# Patient Record
Sex: Female | Born: 1935 | Race: White | Hispanic: No | Smoking: Never smoker
Health system: Southern US, Community
[De-identification: ages and names within clinical notes are randomized; demographics above are authoritative.]

## PROBLEM LIST (undated history)

## (undated) DIAGNOSIS — H3530 Unspecified macular degeneration: Secondary | ICD-10-CM

## (undated) DIAGNOSIS — K635 Polyp of colon: Secondary | ICD-10-CM

## (undated) DIAGNOSIS — K589 Irritable bowel syndrome without diarrhea: Secondary | ICD-10-CM

## (undated) DIAGNOSIS — F329 Major depressive disorder, single episode, unspecified: Secondary | ICD-10-CM

## (undated) DIAGNOSIS — E669 Obesity, unspecified: Secondary | ICD-10-CM

## (undated) DIAGNOSIS — F32A Depression, unspecified: Secondary | ICD-10-CM

## (undated) DIAGNOSIS — M1990 Unspecified osteoarthritis, unspecified site: Secondary | ICD-10-CM

## (undated) DIAGNOSIS — K5790 Diverticulosis of intestine, part unspecified, without perforation or abscess without bleeding: Secondary | ICD-10-CM

## (undated) DIAGNOSIS — F419 Anxiety disorder, unspecified: Secondary | ICD-10-CM

## (undated) HISTORY — DX: Major depressive disorder, single episode, unspecified: F32.9

## (undated) HISTORY — DX: Unspecified macular degeneration: H35.30

## (undated) HISTORY — DX: Diverticulosis of intestine, part unspecified, without perforation or abscess without bleeding: K57.90

## (undated) HISTORY — DX: Unspecified osteoarthritis, unspecified site: M19.90

## (undated) HISTORY — PX: BRAIN SURGERY: SHX531

## (undated) HISTORY — PX: NASAL SINUS SURGERY: SHX719

## (undated) HISTORY — DX: Polyp of colon: K63.5

## (undated) HISTORY — DX: Anxiety disorder, unspecified: F41.9

## (undated) HISTORY — PX: OTHER SURGICAL HISTORY: SHX169

## (undated) HISTORY — PX: ABDOMINAL HYSTERECTOMY: SHX81

## (undated) HISTORY — PX: GALLBLADDER SURGERY: SHX652

## (undated) HISTORY — DX: Obesity, unspecified: E66.9

## (undated) HISTORY — DX: Depression, unspecified: F32.A

## (undated) HISTORY — DX: Irritable bowel syndrome without diarrhea: K58.9

---

## 2016-04-29 ENCOUNTER — Other Ambulatory Visit: Payer: Self-pay | Admitting: Nurse Practitioner

## 2016-04-29 DIAGNOSIS — K5781 Diverticulitis of intestine, part unspecified, with perforation and abscess with bleeding: Secondary | ICD-10-CM

## 2016-05-06 ENCOUNTER — Other Ambulatory Visit: Payer: Self-pay

## 2016-05-09 ENCOUNTER — Ambulatory Visit
Admission: RE | Admit: 2016-05-09 | Discharge: 2016-05-09 | Disposition: A | Discharge status: Home / Self Care | Payer: Medicare Other | Source: Ambulatory Visit | Attending: Nurse Practitioner | Admitting: Nurse Practitioner

## 2016-05-09 DIAGNOSIS — K5781 Diverticulitis of intestine, part unspecified, with perforation and abscess with bleeding: Secondary | ICD-10-CM

## 2016-05-09 MED ORDER — IOPAMIDOL (ISOVUE-300) INJECTION 61%
100.0000 mL | Freq: Once | INTRAVENOUS | Status: AC | PRN
  Administered 2016-05-09: 100 mL via INTRAVENOUS

## 2016-07-02 ENCOUNTER — Encounter: Payer: Self-pay | Admitting: Physician Assistant

## 2016-07-15 ENCOUNTER — Ambulatory Visit: Payer: Medicare Other | Admitting: Physician Assistant

## 2016-07-22 ENCOUNTER — Encounter: Payer: Self-pay | Admitting: Physician Assistant

## 2016-07-22 ENCOUNTER — Ambulatory Visit (INDEPENDENT_AMBULATORY_CARE_PROVIDER_SITE_OTHER): Payer: 59 | Admitting: Physician Assistant

## 2016-07-22 ENCOUNTER — Encounter (INDEPENDENT_AMBULATORY_CARE_PROVIDER_SITE_OTHER): Payer: Self-pay

## 2016-07-22 VITALS — BP 120/80 | HR 74 | Ht 60.25 in | Wt 189.0 lb

## 2016-07-22 DIAGNOSIS — I10 Essential (primary) hypertension: Secondary | ICD-10-CM | POA: Diagnosis not present

## 2016-07-22 DIAGNOSIS — K582 Mixed irritable bowel syndrome: Secondary | ICD-10-CM | POA: Diagnosis not present

## 2016-07-22 DIAGNOSIS — K5730 Diverticulosis of large intestine without perforation or abscess without bleeding: Secondary | ICD-10-CM | POA: Diagnosis not present

## 2016-07-22 DIAGNOSIS — M1990 Unspecified osteoarthritis, unspecified site: Secondary | ICD-10-CM | POA: Insufficient documentation

## 2016-07-22 NOTE — Progress Notes (Addendum)
Subjective:    Patient ID: Karen Espinoza, female    DOB: Nov 04, 1924, 83 y.o.   MRN: [id number]  HPI Karen Espinoza is a pleasant 83 year old white female, new to GI today referred by Manus GunningMaggie Mazurek NP to get established with GI.  Patient has history of long-standing IBS and diverticulosis. [place] and is currently living at [facility]. Patient is not having any significant active problems. She says she went through a period of constipation and after relocating but that has improved. Her daughter says she tends to go through periods of diarrhea alternating with constipation. She has been using MiraLAX on an as-needed basis. Patient also is trying to drink prune juice daily and improve her water intake.   She says on a good day she'll have 1-2 bowel movements per day, on a bad day she may have a couple of episodes of diarrhea and then periodically get constipated. She knows that she has diverticulosis and avoids seeds and nuts she cannot tell me whether she's had diverticulitis in the past. She did have a gastroenterologist in [place] and has had several colonoscopies for history of polyps.  Review of Systems Pertinent positive and negative review of systems were noted in the above HPI section.  All other review of systems was otherwise negative.  Outpatient Encounter Prescriptions as of 07/22/2016  Medication Sig  . acetaminophen (TYLENOL) 650 MG CR tablet Take 650 mg by mouth every 6 (six) hours as needed for pain.  . Albuterol (VENTOLIN IN) Inhale into the lungs. Inhale 2 puffs every 4 hours as needed for SOB  . bisacodyl (DULCOLAX) 10 MG suppository Place 10 mg rectally daily as needed for moderate constipation.  . Calcium Carb-Cholecalciferol (CALTRATE 600+D) 600-800 MG-UNIT TABS Take by mouth.  . Calcium Carb-Cholecalciferol (CALTRATE 600+D3 SOFT) 600-800 MG-UNIT CHEW Chew by mouth. Take 1 chew twice daily    . cetirizine (ZYRTEC) 10 MG tablet Take 10 mg by mouth daily.  . fluticasone (FLONASE) 50 MCG/ACT nasal spray Place 2 sprays into both nostrils daily.  Marland Kitchen. gabapentin (NEURONTIN) 300 MG capsule Take 300 mg by mouth 3 (three) times daily.  Marland Kitchen. guaiFENesin-dextromethorphan (ROBITUSSIN DM) 100-10 MG/5ML syrup Take 10 mLs by mouth every 4 (four) hours as needed for cough.  . hydrocortisone (CORTEF) 10 MG tablet Take 10 mg by mouth daily.  Marland Kitchen. lisinopril (PRINIVIL,ZESTRIL) 40 MG tablet Take 40 mg by mouth daily.  Marland Kitchen. loperamide (IMODIUM A-D) 2 MG tablet Take 2 mg by mouth 4 (four) times daily as needed for diarrhea or loose stools.  Marland Kitchen. LORazepam (ATIVAN) 0.5 MG tablet Take 0.5 mg by mouth at bedtime.  Marland Kitchen. LORazepam (ATIVAN) 0.5 MG tablet Take 0.5 mg by mouth 2 (two) times daily as needed for anxiety.  . magnesium hydroxide (MILK OF MAGNESIA) 400 MG/5ML suspension Take 30 mLs by mouth daily as needed for mild constipation.  . meloxicam (MOBIC) 15 MG tablet Take 15 mg by mouth daily.  . metoprolol succinate (TOPROL-XL) 50 MG 24 hr tablet Take 50 mg by mouth 2 (two) times daily. Take with or immediately following a meal.  . MULTIPLE VITAMIN PO Take 1 tablet by mouth daily.  . ondansetron (ZOFRAN) 4 MG tablet Take 4 mg by mouth every 8 (eight) hours as needed for nausea or vomiting.  . polyethylene glycol (MIRALAX / GLYCOLAX) packet Take 17 g by mouth daily as needed.  . sertraline (ZOLOFT) 50 MG tablet Take 50 mg by  mouth at bedtime.  . simethicone (MYLICON) 125 MG chewable tablet Chew 125 mg by mouth every 6 (six) hours as needed for flatulence.  . sodium phosphate (FLEET) 7-19 GM/118ML ENEM Place 1 enema rectally daily as needed for severe constipation.   No facility-administered encounter medications on file as of 07/22/2016.    Allergies  Allergen Reactions  . Levaquin [Levofloxacin In D5w]   . Pneumococcal Vaccines   . Sulfa Antibiotics    There are no active problems to display for this  patient.  Social History   Social History  . Marital status: Widowed    Spouse name: N/A  . Number of children: 1  . Years of education: N/A   Occupational History  . retired    Social History Main Topics  . Smoking status: Never Smoker  . Smokeless tobacco: Never Used  . Alcohol use No  . Drug use: No  . Sexual activity: No   Other Topics Concern  . Not on file   Social History Narrative  . No narrative on file    Karen Espinoza family history is not on file.      Objective:    Vitals:   07/22/16 1014  BP: 120/80  Pulse: 74    Physical Exam  well-developed elderly white female in no acute distress, ambulate with a walker. Accompanied by her daughter blood pressure 120/80 pulse 74 patient speaks [place] primarily but able to answer some questions in [place]. HEENT; nontraumatic, cephalic EOMI PERRLA sclera anicteric, Cardiovascular; regular rate and rhythm with S1-S2 no murmur or gallop, Pulmonary ;clear bilaterally, Abdomen;; soft, obese, nontender nondistended bowel sounds are active there is no palpable mass or hepatosplenomegaly, Rectal; exam not done, Neuropsych; mood and affect appropriate       Assessment & Plan:   #45 83 year old white female primarily French-speaking with long-standing history of IBS and diverticulosis. She primarily has difficulty with constipation, and infrequently alternates between constipation and diarrhea. #2 osteoarthritis #3 hypertension #4 depression  Plan; patient has signed a release and will obtain copies of her previous GI records. She will continue to use MiraLAX 17 g in 8 ounces of water on an as-needed basis. We discussed prune juice on a daily basis and liberal water intake. We'll give her a trial of IB Gard  one by mouth twice a day Patient will be established with Dr. Lavon Paganini and will follow-up with Dr. Lavon Paganini or myself on an as-needed basis.  Addendum; previous records received. Colonoscopy February 2012 Dr.  Cheryl [facility] 7 mm cecal polyp sessile removed left-sided diverticuli and transverse colon diverticuli. Path report attached is unreadable. Also had upper GI small bowel follow-through done 2009. Normal      Amy S Esterwood PA-C 07/22/2016   Cc: Manus Gunning, FNP

## 2016-07-22 NOTE — Patient Instructions (Signed)
We have provided you with samples of IBgard . Take 1 capsule twice daily.  Continue Miralax as needed.  Drink 1 glass of prune juice daily. Push fluids. Follow up as needed.

## 2016-07-23 ENCOUNTER — Telehealth: Payer: Self-pay | Admitting: *Deleted

## 2016-07-23 NOTE — Progress Notes (Signed)
Reviewed and agree with documentation and assessment and plan. K. Veena Nandigam , MD   

## 2016-07-23 NOTE — Telephone Encounter (Signed)
Fax[facility] the [facility] note [facility], to [facility] [place]. Fax # [phone].

## 2016-07-23 NOTE — Progress Notes (Signed)
Reviewed and agree with documentation and assessment and plan. K. Veena Natalyia Innes , MD   

## 2016-07-29 ENCOUNTER — Telehealth: Payer: Self-pay | Admitting: Physician Assistant

## 2016-07-29 ENCOUNTER — Other Ambulatory Visit: Payer: Self-pay | Admitting: *Deleted

## 2016-07-29 MED ORDER — AMBULATORY NON FORMULARY MEDICATION: 3 refills | Status: DC

## 2016-07-29 NOTE — Progress Notes (Signed)
Spoke to [place] [facility] [place]. Faxed her a prescription  For the IB [facility] under Ambulatory-Non-Formulary. Patient is to take 1 capsule twice daily.

## 2016-07-29 NOTE — Telephone Encounter (Signed)
See note from 07-29-2016.

## 2016-08-26 ENCOUNTER — Emergency Department (HOSPITAL_COMMUNITY): Payer: Medicare Other

## 2016-08-26 ENCOUNTER — Encounter (HOSPITAL_COMMUNITY): Payer: Self-pay

## 2016-08-26 ENCOUNTER — Inpatient Hospital Stay (HOSPITAL_COMMUNITY)
Admission: EM | Admit: 2016-08-26 | Discharge: 2016-08-28 | DRG: 291 | Disposition: A | Discharge status: Skilled Nursing Facility | Payer: Medicare Other | Attending: Internal Medicine | Admitting: Internal Medicine

## 2016-08-26 DIAGNOSIS — F419 Anxiety disorder, unspecified: Secondary | ICD-10-CM | POA: Diagnosis present

## 2016-08-26 DIAGNOSIS — I509 Heart failure, unspecified: Secondary | ICD-10-CM | POA: Diagnosis not present

## 2016-08-26 DIAGNOSIS — J9601 Acute respiratory failure with hypoxia: Secondary | ICD-10-CM | POA: Diagnosis not present

## 2016-08-26 DIAGNOSIS — F329 Major depressive disorder, single episode, unspecified: Secondary | ICD-10-CM | POA: Diagnosis present

## 2016-08-26 DIAGNOSIS — I110 Hypertensive heart disease with heart failure: Secondary | ICD-10-CM | POA: Diagnosis not present

## 2016-08-26 DIAGNOSIS — R0602 Shortness of breath: Secondary | ICD-10-CM

## 2016-08-26 DIAGNOSIS — Z6833 Body mass index (BMI) 33.0-33.9, adult: Secondary | ICD-10-CM

## 2016-08-26 DIAGNOSIS — I5033 Acute on chronic diastolic (congestive) heart failure: Secondary | ICD-10-CM | POA: Diagnosis present

## 2016-08-26 DIAGNOSIS — Z79899 Other long term (current) drug therapy: Secondary | ICD-10-CM

## 2016-08-26 DIAGNOSIS — E669 Obesity, unspecified: Secondary | ICD-10-CM | POA: Diagnosis present

## 2016-08-26 DIAGNOSIS — H3530 Unspecified macular degeneration: Secondary | ICD-10-CM | POA: Diagnosis present

## 2016-08-26 DIAGNOSIS — F0390 Unspecified dementia without behavioral disturbance: Secondary | ICD-10-CM | POA: Diagnosis present

## 2016-08-26 DIAGNOSIS — R7989 Other specified abnormal findings of blood chemistry: Secondary | ICD-10-CM

## 2016-08-26 LAB — COMPREHENSIVE METABOLIC PANEL
ALBUMIN: 4.3 g/dL (ref 3.5–5.0)
ALK PHOS: 93 U/L (ref 38–126)
ALT: 25 U/L (ref 14–54)
AST: 22 U/L (ref 15–41)
Anion gap: 8 (ref 5–15)
BILIRUBIN TOTAL: 1.4 mg/dL — AB (ref 0.3–1.2)
BUN: 24 mg/dL — AB (ref 6–20)
CALCIUM: 9.2 mg/dL (ref 8.9–10.3)
CO2: 33 mmol/L — ABNORMAL HIGH (ref 22–32)
Chloride: 102 mmol/L (ref 101–111)
Creatinine, Ser: 0.82 mg/dL (ref 0.44–1.00)
GFR calc Af Amer: >60 mL/min (ref >60)
GLUCOSE: 94 mg/dL (ref 65–99)
POTASSIUM: 4.6 mmol/L (ref 3.5–5.1)
Sodium: 143 mmol/L (ref 135–145)
TOTAL PROTEIN: 7.1 g/dL (ref 6.5–8.1)

## 2016-08-26 LAB — CBC WITH DIFFERENTIAL/PLATELET
Basophils Absolute: 0 10*3/uL (ref 0.0–0.1)
Basophils Relative: 0 %
Eosinophils Absolute: 0.2 10*3/uL (ref 0.0–0.7)
Eosinophils Relative: 2 %
HCT: 43.6 % (ref 36.0–46.0)
Hemoglobin: 14.2 g/dL (ref 12.0–15.0)
Lymphocytes Relative: 12 %
Lymphs Abs: 1.3 10*3/uL (ref 0.7–4.0)
MCH: 32.1 pg (ref 26.0–34.0)
MCHC: 32.6 g/dL (ref 30.0–36.0)
MCV: 98.4 fL (ref 78.0–100.0)
Monocytes Absolute: 0.4 10*3/uL (ref 0.1–1.0)
Monocytes Relative: 4 %
Neutro Abs: 8.6 10*3/uL — ABNORMAL HIGH (ref 1.7–7.7)
Neutrophils Relative %: 82 %
Platelets: 317 10*3/uL (ref 150–400)
RBC: 4.43 MIL/uL (ref 3.87–5.11)
RDW: 15.2 % (ref 11.5–15.5)
WBC: 10.5 10*3/uL (ref 4.0–10.5)

## 2016-08-26 LAB — URINALYSIS, ROUTINE W REFLEX MICROSCOPIC
Bilirubin Urine: NEGATIVE
Glucose, UA: NEGATIVE mg/dL
Hgb urine dipstick: NEGATIVE
Ketones, ur: NEGATIVE mg/dL
Nitrite: NEGATIVE
Protein, ur: NEGATIVE mg/dL
Specific Gravity, Urine: 1.012 (ref 1.005–1.030)
pH: 6 (ref 5.0–8.0)

## 2016-08-26 LAB — PROTIME-INR
INR: 1.15
Prothrombin Time: 14.8 seconds (ref 11.4–15.2)

## 2016-08-26 LAB — BRAIN NATRIURETIC PEPTIDE: B Natriuretic Peptide: 253.8 pg/mL — ABNORMAL HIGH (ref 0.0–100.0)

## 2016-08-26 LAB — MRSA PCR SCREENING: MRSA BY PCR: NEGATIVE

## 2016-08-26 LAB — TROPONIN I: Troponin I: <0.03 ng/mL (ref <0.03)

## 2016-08-26 LAB — I-STAT CG4 LACTIC ACID, ED: LACTIC ACID, VENOUS: 0.74 mmol/L (ref 0.5–1.9)

## 2016-08-26 MED ORDER — ACETAMINOPHEN 650 MG RE SUPP: 650.0000 mg | Freq: Four times a day (QID) | RECTAL | Status: DC | PRN

## 2016-08-26 MED ORDER — LORAZEPAM 0.5 MG PO TABS
0.5000 mg | ORAL_TABLET | Freq: Every day | ORAL | Status: DC
  Administered 2016-08-26 – 2016-08-27 (×2): 0.5 mg via ORAL
  Filled 2016-08-26 (×2): qty 1

## 2016-08-26 MED ORDER — SODIUM CHLORIDE 0.9% FLUSH: 3.0000 mL | INTRAVENOUS | Status: DC | PRN

## 2016-08-26 MED ORDER — GABAPENTIN 300 MG PO CAPS
300.0000 mg | ORAL_CAPSULE | Freq: Three times a day (TID) | ORAL | Status: DC
  Administered 2016-08-26 – 2016-08-28 (×7): 300 mg via ORAL
  Filled 2016-08-26 (×6): qty 1

## 2016-08-26 MED ORDER — SODIUM CHLORIDE 0.9 % IV SOLN: 250.0000 mL | INTRAVENOUS | Status: DC | PRN

## 2016-08-26 MED ORDER — ENOXAPARIN SODIUM 40 MG/0.4ML ~~LOC~~ SOLN
40.0000 mg | SUBCUTANEOUS | Status: DC
  Administered 2016-08-26 – 2016-08-27 (×2): 40 mg via SUBCUTANEOUS
  Filled 2016-08-26 (×2): qty 0.4

## 2016-08-26 MED ORDER — SERTRALINE HCL 50 MG PO TABS
50.0000 mg | ORAL_TABLET | Freq: Every day | ORAL | Status: DC
  Administered 2016-08-26 – 2016-08-27 (×2): 50 mg via ORAL
  Filled 2016-08-26 (×2): qty 1

## 2016-08-26 MED ORDER — POLYETHYLENE GLYCOL 3350 17 G PO PACK: 17.0000 g | PACK | Freq: Every day | ORAL | Status: DC | PRN

## 2016-08-26 MED ORDER — FUROSEMIDE 10 MG/ML IJ SOLN
20.0000 mg | INTRAMUSCULAR | Status: AC
  Administered 2016-08-26: 20 mg via INTRAVENOUS
  Filled 2016-08-26: qty 4

## 2016-08-26 MED ORDER — ACETAMINOPHEN 325 MG PO TABS: 650.0000 mg | ORAL_TABLET | Freq: Four times a day (QID) | ORAL | Status: DC | PRN

## 2016-08-26 MED ORDER — FUROSEMIDE 10 MG/ML IJ SOLN
20.0000 mg | Freq: Two times a day (BID) | INTRAMUSCULAR | Status: DC
  Administered 2016-08-27 – 2016-08-28 (×4): 20 mg via INTRAVENOUS
  Filled 2016-08-26 (×4): qty 2

## 2016-08-26 MED ORDER — SODIUM CHLORIDE 0.9% FLUSH
3.0000 mL | Freq: Two times a day (BID) | INTRAVENOUS | Status: DC
  Administered 2016-08-26 – 2016-08-28 (×3): 3 mL via INTRAVENOUS

## 2016-08-26 MED ORDER — SODIUM CHLORIDE 0.9% FLUSH
3.0000 mL | Freq: Two times a day (BID) | INTRAVENOUS | Status: DC
  Administered 2016-08-26 – 2016-08-28 (×4): 3 mL via INTRAVENOUS

## 2016-08-26 MED ORDER — LISINOPRIL 20 MG PO TABS
40.0000 mg | ORAL_TABLET | Freq: Every day | ORAL | Status: DC
  Administered 2016-08-27 – 2016-08-28 (×2): 40 mg via ORAL
  Filled 2016-08-26 (×2): qty 2

## 2016-08-26 MED ORDER — HYDROCORTISONE 10 MG PO TABS
10.0000 mg | ORAL_TABLET | Freq: Every day | ORAL | Status: DC
  Administered 2016-08-27 – 2016-08-28 (×2): 10 mg via ORAL
  Filled 2016-08-26 (×2): qty 1

## 2016-08-26 MED ORDER — DONEPEZIL HCL 5 MG PO TABS
5.0000 mg | ORAL_TABLET | Freq: Every day | ORAL | Status: DC
  Administered 2016-08-26 – 2016-08-27 (×2): 5 mg via ORAL
  Filled 2016-08-26 (×2): qty 1

## 2016-08-26 NOTE — ED Triage Notes (Addendum)
Patient BIB EMS [facility]. Staff told EMS that the patient has had increased SOB- staff questions if the patient has pneumonia. Patient does not usually require oxygen, but sats in triage are 90-92% on RA. Patient went to the bathroom prior to triage being completed using the [facility]- upon returning to the room, patient's oxygen saturations on RA were 81% and patient was very SOB. On 2LNC, patient's oxygen saturations return to 96%. Patient is alert and oriented in triage.

## 2016-08-26 NOTE — ED Notes (Signed)
Bed: WA04 Expected date:  Expected time:  Means of arrival:  Comments: EMS-PNA 

## 2016-08-26 NOTE — ED Provider Notes (Signed)
[facility]-EMERGENCY DEPT Provider Note   CSN: [id number] Arrival date & time: 08/26/16  1200     History   Chief Complaint Chief Complaint  Patient presents with  . Shortness of Breath    HPI Karen Espinoza is a 83 y.o. female.  Karen Espinoza is a 83 y.o. Female who presents to the emergency department from her skilled nursing facility complaining of increasing shortness of breath and fatigue over the past week. Patient reports she hears a rattle in her chest and is worried she might have pneumonia. She denies increased coughing or fevers. She reports she has some mild leg swelling, but it has not increased from her baseline. She denies any history of CHF or heart failure. She reports her shortness of breath is worse with exertion. She does not use oxygen at home. She has required oxygen since arrival to the emergency department and according to EMS her oxygen saturation dropped to 81% on room air while ambulating today.She denies fevers, abdominal pain, nausea, vomiting, increased coughing, hemoptysis, chest pain, or rashes.   The history is provided by the patient and medical records. No language interpreter was used.  Shortness of Breath  Associated symptoms include leg swelling. Pertinent negatives include no fever, no headaches, no sore throat, no neck pain, no cough, no wheezing, no chest pain, no vomiting, no abdominal pain and no rash.    Past Medical History:  Diagnosis Date  . Anxiety   . Colon polyps   . Depression   . Diverticulosis   . IBS (irritable bowel syndrome)   . Macular degeneration   . Obesity   . Osteoarthritis     Patient Active Problem List   Diagnosis Date Noted  . HTN (hypertension) 07/22/2016  . OA (osteoarthritis) 07/22/2016    Past Surgical History:  Procedure Laterality Date  . ABDOMINAL HYSTERECTOMY    . BRAIN SURGERY     benign tumor  . colonscopy    . GALLBLADDER SURGERY    . NASAL SINUS SURGERY      OB History    No data available       Home Medications    Prior to Admission medications   Medication Sig Start Date End Date Taking? Authorizing Provider  acetaminophen (TYLENOL) 650 MG CR tablet Take 650 mg by mouth every 6 (six) hours as needed for pain.   Yes [provider]  albuterol (PROVENTIL HFA;VENTOLIN HFA) 108 (90 Base) MCG/ACT inhaler Inhale 2 puffs into the lungs every 4 (four) hours as needed for wheezing or shortness of breath.   Yes [provider]  AMBULATORY NON FORMULARY MEDICATION Medication Name: IBGard  Take 1 capsule by mouth twice daily. 07/29/16  Yes Esterwood, Amy S, PA-C  bisacodyl (DULCOLAX) 10 MG suppository Place 10 mg rectally daily as needed for moderate constipation.   Yes [provider]  Calcium Carb-Cholecalciferol (CALTRATE 600+D3 SOFT) 600-800 MG-UNIT CHEW Chew by mouth. Take 1 chew twice daily   Yes [provider]  cetirizine (ZYRTEC) 10 MG tablet Take 10 mg by mouth daily.   Yes [provider]  donepezil (ARICEPT) 5 MG tablet Take 5 mg by mouth at bedtime.   Yes [provider]  fluticasone (FLONASE) 50 MCG/ACT nasal spray Place 2 sprays into both nostrils daily.   Yes [provider]  gabapentin (NEURONTIN) 300 MG capsule Take 300 mg by mouth 3 (three) times daily.   Yes [provider]  guaiFENesin-dextromethorphan (ROBITUSSIN DM) 100-10 MG/5ML syrup Take 10 mLs  by mouth every 4 (four) hours as needed for cough.   Yes [provider]  hydrocortisone (CORTEF) 10 MG tablet Take 10 mg by mouth daily.   Yes [provider]  ibuprofen (ADVIL,MOTRIN) 400 MG tablet Take 400 mg by mouth every 6 (six) hours as needed.   Yes [provider]  lisinopril (PRINIVIL,ZESTRIL) 40 MG tablet Take 40 mg by mouth daily.   Yes [provider]  loperamide (IMODIUM A-D) 2 MG tablet Take 2 mg by mouth 4 (four) times daily as needed for diarrhea or loose stools.   Yes [provider]  LORazepam (ATIVAN) 0.5 MG tablet Take 0.5 mg by mouth at bedtime.   Yes [provider]  LORazepam (ATIVAN) 0.5 MG tablet Take 0.5 mg by mouth 2 (two) times daily as needed for anxiety.   Yes [provider]  magnesium hydroxide (MILK OF MAGNESIA) 400 MG/5ML suspension Take 30 mLs by mouth daily as needed for mild constipation.   Yes [provider]  meloxicam (MOBIC) 15 MG tablet Take 15 mg by mouth daily.   Yes [provider]  metoprolol tartrate (LOPRESSOR) 50 MG tablet Take 50 mg by mouth 2 (two) times daily.   Yes [provider]  Multiple Vitamin tablet Take 1 tablet by mouth daily. Daily vite   Yes [provider]  ondansetron (ZOFRAN) 4 MG tablet Take 4 mg by mouth every 8 (eight) hours as needed for nausea or vomiting.   Yes [provider]  polyethylene glycol (MIRALAX / GLYCOLAX) packet Take 17 g by mouth daily as needed.   Yes [provider]  sertraline (ZOLOFT) 50 MG tablet Take 50 mg by mouth at bedtime.   Yes [provider]  simethicone (MYLICON) 125 MG chewable tablet Chew 125 mg by mouth every 6 (six) hours as needed for flatulence.   Yes [provider]  sodium phosphate (FLEET) 7-19 GM/118ML ENEM Place 1 enema rectally daily as needed for severe constipation.   Yes [provider]    Family History History reviewed. No pertinent family history.  Social History Social History  Substance Use Topics  . Smoking status: Never Smoker  . Smokeless tobacco: Never Used  . Alcohol use No     Allergies   Levaquin [levofloxacin in d5w]; Pneumococcal vaccines; and Sulfa antibiotics   Review of Systems Review of Systems  Constitutional: Positive for fatigue. Negative for chills and fever.  HENT: Negative for congestion and sore throat.   Eyes: Negative for visual disturbance.  Respiratory: Positive for shortness of breath. Negative for cough and wheezing.     Cardiovascular: Positive for leg swelling. Negative for chest pain and palpitations.  Gastrointestinal: Negative for abdominal pain, diarrhea, nausea and vomiting.  Genitourinary: Negative for dysuria.  Musculoskeletal: Negative for back pain and neck pain.  Skin: Negative for rash.  Neurological: Negative for headaches.     Physical Exam Updated Vital Signs BP (!) 184/66   Pulse (!) 55   Temp 98.6 F (37 C) (Oral)   Resp 19   SpO2 99%   Physical Exam  Constitutional: She is oriented to person, place, and time. She appears well-developed and well-nourished. No distress.  Nontoxic appearing.  HENT:  Head: Normocephalic and atraumatic.  Mouth/Throat: Oropharynx is clear and moist.  Eyes: Pupils are equal, round, and reactive to light. Conjunctivae are normal. Right eye exhibits no discharge. Left eye exhibits no discharge.  Neck: Neck supple.  Cardiovascular: Normal rate, regular rhythm,  normal heart sounds and intact distal pulses.  Exam reveals no gallop and no friction rub.   No murmur heard. Pulmonary/Chest: Effort normal. No respiratory distress. She has no wheezes. She has rales.  Crackles noted to her bilateral bases. No increased work of breathing. Oxygen saturation 95% on 2 L via nasal cannula.  Abdominal: Soft. There is no tenderness.  Musculoskeletal: She exhibits edema.  Mild bilateral pitting lower extremity edema.  Lymphadenopathy:    She has no cervical adenopathy.  Neurological: She is alert and oriented to person, place, and time. Coordination normal.  Skin: Skin is warm and dry. No rash noted. She is not diaphoretic. No erythema. No pallor.  Psychiatric: She has a normal mood and affect. Her behavior is normal.  Nursing note and vitals reviewed.    ED Treatments / Results  Labs (all labs ordered are listed, but only abnormal results are displayed) Labs Reviewed  COMPREHENSIVE METABOLIC PANEL - Abnormal; Notable for the following:       Result Value    CO2 33 (*)    BUN 24 (*)    Total Bilirubin 1.4 (*)    All other components within normal limits  URINALYSIS, ROUTINE W REFLEX MICROSCOPIC - Abnormal; Notable for the following:    APPearance HAZY (*)    Leukocytes, UA TRACE (*)    Bacteria, UA RARE (*)    Squamous Epithelial / LPF 0-5 (*)    All other components within normal limits  CBC WITH DIFFERENTIAL/PLATELET - Abnormal; Notable for the following:    Neutro Abs 8.6 (*)    All other components within normal limits  BRAIN NATRIURETIC PEPTIDE - Abnormal; Notable for the following:    B Natriuretic Peptide 253.8 (*)    All other components within normal limits  CULTURE, BLOOD (ROUTINE X 2)  CULTURE, BLOOD (ROUTINE X 2)  PROTIME-INR  TROPONIN I  CBC WITH DIFFERENTIAL/PLATELET  BRAIN NATRIURETIC PEPTIDE  TROPONIN I  I-STAT CG4 LACTIC ACID, ED  I-STAT CG4 LACTIC ACID, ED    EKG  EKG Interpretation  Date/Time:  Tuesday August 26 2016 12:25:02 EDT Ventricular Rate:  64 PR Interval:    QRS Duration: 92 QT Interval:  408 QTC Calculation: 421 R Axis:   25 Text Interpretation:  Sinus rhythm Consider left atrial enlargement No acute changes No old tracing to compare Confirmed by Derwood Kaplan ([id number]) on 08/26/2016 2:30:59 PM       Radiology Dg Chest 2 View  Result Date: 08/26/2016 CLINICAL DATA:  Shortness of breath. EXAM: CHEST  2 VIEW COMPARISON:  No prior. FINDINGS: Borderline cardiomegaly. Low lung volumes. Mild bilateral pulmonary infiltrates/edema. Bilateral pleural effusions. No pneumothorax. IMPRESSION: Low lung volumes . Bilateral pulmonary infiltrates/edema and bilateral pleural effusions. Electronically Signed   By: Maisie Fus  Register   On: 08/26/2016 12:45    Procedures Procedures (including critical care time)  Medications Ordered in ED Medications  furosemide (LASIX) injection 20 mg (not administered)     Initial Impression / Assessment and Plan / ED Course  I have reviewed the triage vital signs and the  nursing notes.  Pertinent labs & imaging results that were available during my care of the patient were reviewed by me and considered in my medical decision making (see chart for details).    This is a 83 y.o. Female who presents to the emergency department from her skilled nursing facility complaining of increasing shortness of breath and fatigue over the past week. Patient reports she hears a rattle in  her chest and is worried she might have pneumonia. She denies increased coughing or fevers. She reports she has some mild leg swelling, but it has not increased from her baseline. She denies any history of CHF or heart failure. She reports her shortness of breath is worse with exertion. She does not use oxygen at home. She has required oxygen since arrival to the emergency department and according to EMS her oxygen saturation dropped to 81% on room air while ambulating today. On exam the patient is afebrile nontoxic appearing. She has crackles noted to her bilateral lower bases on lung exam. She is no increased work of breathing. She is requiring oxygen in the emergency department. She does have some mild lower extremity edema. She's had no increased coughing or fevers. Concern for possible CHF exacerbation. Patient denies any history of CHF and does not appear to be on any diuretics. No history of her recent echocardiogram. Lactic acid is within normal limits. His CMP shows preserved kidney function. Troponin is not elevated. CBC shows no leukocytosis. BNP is elevated at 253. Chest x-ray shows low lung volumes and bilateral pulmonary infiltrates versus edema and bilateral pleural effusions. After viewing the x-ray myself and is suspicious for CHF in this patient, based on history, exam and lab findings. She has no fever, no leukocytosis and no increased cough. Have lower suspicion for pneumonia. Will start the patient on 20 mg of IV Lasix that she is lasix nave. Will admit to medicine for continued work  up. Patient agrees with plan for admission.  I consulted with triad hospitalist Dr. Irene Limbo who accepted the patient for admission.   This patient was discussed with and evaluated by Dr. Rhunette Croft who agrees with assessment and plan.   Final Clinical Impressions(s) / ED Diagnoses   Final diagnoses:  Shortness of breath  Acute congestive heart failure, unspecified heart failure type (HCC)  Elevated brain natriuretic peptide (BNP) level    New Prescriptions New Prescriptions   No medications on file     Everlene Farrier, Cordelia Poche 08/26/16 1528    Derwood Kaplan, MD 08/27/16 [phone]

## 2016-08-26 NOTE — H&P (Addendum)
History and Physical  Karen Espinoza GNF:[id number] DOB: [birth year] DOA: 08/26/2016  PCP: Manus Gunning, FNP  Patient coming from: assisted living  Chief Complaint: short of breath  HPI:  83 year old-year-old woman, uncomplicated past medical history, presented with increasing shortness of breath. Initial evaluation suggested acute CHF.  Reports increasing shortness of breath over the last 1-2 weeks, worsening with time, aggravated by lying flat, improved with sitting up, becoming more severe. No history of cardiac disease. No cough or fever.  ED Course: Afebrile, hypertensive but vital signs stable   Review of Systems:  Negative for fever, new visual changes, sore throat, rash, new muscle aches, chest pain, dysuria, bleeding, nausea, vomiting, abdominal pain.   Past Medical History:  Diagnosis Date  . Anxiety   . Colon polyps   . Depression   . Diverticulosis   . IBS (irritable bowel syndrome)   . Macular degeneration   . Obesity   . Osteoarthritis     Past Surgical History:  Procedure Laterality Date  . ABDOMINAL HYSTERECTOMY    . BRAIN SURGERY     benign tumor  . colonscopy    . GALLBLADDER SURGERY    . NASAL SINUS SURGERY       reports that she has never smoked. She has never used smokeless tobacco. She reports that she does not drink alcohol or use drugs.   Allergies  Allergen Reactions  . Levaquin [Levofloxacin In D5w]   . Pneumococcal Vaccines   . Sulfa Antibiotics     Family History  Problem Relation Age of Onset  . Family history unknown: Yes     Prior to Admission medications   Medication Sig Start Date End Date Taking? Authorizing Provider  acetaminophen (TYLENOL) 650 MG CR tablet Take 650 mg by mouth every 6 (six) hours as needed for pain.   Yes [provider]  albuterol (PROVENTIL HFA;VENTOLIN HFA) 108 (90 Base) MCG/ACT inhaler Inhale 2 puffs into the lungs every 4 (four) hours as needed for wheezing or shortness of breath.    Yes [provider]  AMBULATORY NON FORMULARY MEDICATION Medication Name: IBGard  Take 1 capsule by mouth twice daily. 07/29/16  Yes Esterwood, Amy S, PA-C  bisacodyl (DULCOLAX) 10 MG suppository Place 10 mg rectally daily as needed for moderate constipation.   Yes [provider]  Calcium Carb-Cholecalciferol (CALTRATE 600+D3 SOFT) 600-800 MG-UNIT CHEW Chew by mouth. Take 1 chew twice daily   Yes [provider]  cetirizine (ZYRTEC) 10 MG tablet Take 10 mg by mouth daily.   Yes [provider]  donepezil (ARICEPT) 5 MG tablet Take 5 mg by mouth at bedtime.   Yes [provider]  fluticasone (FLONASE) 50 MCG/ACT nasal spray Place 2 sprays into both nostrils daily.   Yes [provider]  gabapentin (NEURONTIN) 300 MG capsule Take 300 mg by mouth 3 (three) times daily.   Yes [provider]  guaiFENesin-dextromethorphan (ROBITUSSIN DM) 100-10 MG/5ML syrup Take 10 mLs by mouth every 4 (four) hours as needed for cough.   Yes [provider]  hydrocortisone (CORTEF) 10 MG tablet Take 10 mg by mouth daily.   Yes [provider]  ibuprofen (ADVIL,MOTRIN) 400 MG tablet Take 400 mg by mouth every 6 (six) hours as needed.   Yes [provider]  lisinopril (PRINIVIL,ZESTRIL) 40 MG tablet Take 40 mg by mouth daily.   Yes [provider]  loperamide (IMODIUM A-D) 2 MG tablet Take 2 mg by mouth 4 (four) times daily  as needed for diarrhea or loose stools.   Yes [provider]  LORazepam (ATIVAN) 0.5 MG tablet Take 0.5 mg by mouth at bedtime.   Yes [provider]  LORazepam (ATIVAN) 0.5 MG tablet Take 0.5 mg by mouth 2 (two) times daily as needed for anxiety.   Yes [provider]  magnesium hydroxide (MILK OF MAGNESIA) 400 MG/5ML suspension Take 30 mLs by mouth daily as needed for mild constipation.   Yes [provider]  meloxicam (MOBIC) 15 MG tablet Take 15 mg by mouth  daily.   Yes [provider]  metoprolol tartrate (LOPRESSOR) 50 MG tablet Take 50 mg by mouth 2 (two) times daily.   Yes [provider]  Multiple Vitamin tablet Take 1 tablet by mouth daily. Daily vite   Yes [provider]  ondansetron (ZOFRAN) 4 MG tablet Take 4 mg by mouth every 8 (eight) hours as needed for nausea or vomiting.   Yes [provider]  polyethylene glycol (MIRALAX / GLYCOLAX) packet Take 17 g by mouth daily as needed.   Yes [provider]  sertraline (ZOLOFT) 50 MG tablet Take 50 mg by mouth at bedtime.   Yes [provider]  simethicone (MYLICON) 125 MG chewable tablet Chew 125 mg by mouth every 6 (six) hours as needed for flatulence.   Yes [provider]  sodium phosphate (FLEET) 7-19 GM/118ML ENEM Place 1 enema rectally daily as needed for severe constipation.   Yes [provider]    Physical Exam: 98.6, 16, 65, 193/69, 96% on 2 L  Constitutional. Appears calm, comfortable.  Psychiatric. Grossly normal mood and affect. Speech fluent and appropriate.  Eyes. Pupils, irises, lids appear unremarkable.  ENT. Grossly normal lips, tongue, hearing.  Neck. No lymphadenopathy or masses. No thyromegaly.  Cardiovascular. Regular rate and rhythm. No murmur, rub or gallop. 2-3 plus bilateral lower extremity edema.  Respiratory. Bilateral rales. No rhonchi or wheezes. Mild increased respiratory effort.  Abdomen. Soft, nontender, nondistended. No hepatomegaly noted.  Skin. No rash or induration. Nontender to palpation.  Musculoskeletal. Grossly normal tone and strength all extremities. Digits and nails of the upper extremities appear unremarkable.  Wt Readings from Last 3 Encounters:  08/26/16 88.5 kg (195 lb 1.7 oz)  07/22/16 85.7 kg (189 lb)    I have personally reviewed following labs and imaging studies  Labs:   Complete metabolic panel unremarkable  Troponin negative, lactic acid within  normal limits, BNP modestly elevated  CBC unremarkable  Imaging studies:   Chest x-ray independently reviewed, bilateral infiltrates, suspect edema.  Medical tests:   EKG independently reviewed sinus rhythm, no acute changes.  Test discussed with performing physician:    Decision to obtain old records:     Review and summation of old records:     Principal Problem:   Acute hypoxemic respiratory failure (HCC) Active Problems:   Acute CHF (HCC)   Assessment/Plan 1. Acute hypoxic respiratory failure, most likely secondary to underlying CHF. -Supplemental oxygen, wean as tolerated  2. Suspected acute CHF, type unknown, associated with bilateral lower extremity edema, hypoxia and shortness of breath. BNP was modestly elevated. No chest pain or history to suggest ACS. EKG nonacute. Suspect diastolic dysfunction. -IV diuresis, daily BMP, telemetry, wean oxygen as tolerated -2-D echocardiogram -Discontinue ibuprofen, Mobic    Severity of Illness: The appropriate patient status for this patient is OBSERVATION. Observation status is judged to be reasonable and necessary in order to provide the required intensity of service  to ensure the patient's safety. The patient's presenting symptoms, physical exam findings, and initial radiographic and laboratory data in the context of their medical condition is felt to place them at decreased risk for further clinical deterioration. Furthermore, it is anticipated that the patient will be medically stable for discharge from the hospital within 2 midnights of admission. The following factors support the patient status of observation.   " The patient's presenting symptoms include shortness of breath. " The physical exam findings include hypoxia, lower extremity edema. " The initial radiographic and laboratory data are pulmonary edema.      DVT prophylaxis: Lovenox Code Status: full Family Communication: none Consults called: none     Time spent: 60 minutes  Brendia Sacksaniel Goodrich, MD  [facility] Direct contact: [phone] --Via amion app OR  --[email or web address]; password [email or web address]  7PM-7AM contact night coverage as above  08/26/2016, 6:17 PM

## 2016-08-26 NOTE — ED Notes (Signed)
Report called to Sharlene Dory4W, Sofia RN. Questions answered. Orders reviewed.

## 2016-08-26 NOTE — ED Notes (Signed)
Patient transported to 4W on telemetry and [facility]. PIV saline locked. Patient and daughter updated on POC. Patient assisted to the restroom prior to transport upstairs.

## 2016-08-27 ENCOUNTER — Encounter (HOSPITAL_COMMUNITY): Payer: Self-pay

## 2016-08-27 ENCOUNTER — Observation Stay (HOSPITAL_BASED_OUTPATIENT_CLINIC_OR_DEPARTMENT_OTHER): Payer: Medicare Other

## 2016-08-27 DIAGNOSIS — H3530 Unspecified macular degeneration: Secondary | ICD-10-CM | POA: Diagnosis present

## 2016-08-27 DIAGNOSIS — J9601 Acute respiratory failure with hypoxia: Secondary | ICD-10-CM | POA: Diagnosis present

## 2016-08-27 DIAGNOSIS — Z79899 Other long term (current) drug therapy: Secondary | ICD-10-CM | POA: Diagnosis not present

## 2016-08-27 DIAGNOSIS — E669 Obesity, unspecified: Secondary | ICD-10-CM | POA: Diagnosis present

## 2016-08-27 DIAGNOSIS — I360 Nonrheumatic tricuspid (valve) stenosis: Secondary | ICD-10-CM | POA: Diagnosis not present

## 2016-08-27 DIAGNOSIS — I5033 Acute on chronic diastolic (congestive) heart failure: Secondary | ICD-10-CM | POA: Diagnosis present

## 2016-08-27 DIAGNOSIS — F0390 Unspecified dementia without behavioral disturbance: Secondary | ICD-10-CM | POA: Diagnosis present

## 2016-08-27 DIAGNOSIS — F419 Anxiety disorder, unspecified: Secondary | ICD-10-CM | POA: Diagnosis present

## 2016-08-27 DIAGNOSIS — Z6833 Body mass index (BMI) 33.0-33.9, adult: Secondary | ICD-10-CM | POA: Diagnosis not present

## 2016-08-27 DIAGNOSIS — R0602 Shortness of breath: Secondary | ICD-10-CM | POA: Diagnosis present

## 2016-08-27 DIAGNOSIS — F329 Major depressive disorder, single episode, unspecified: Secondary | ICD-10-CM | POA: Diagnosis present

## 2016-08-27 DIAGNOSIS — I110 Hypertensive heart disease with heart failure: Secondary | ICD-10-CM | POA: Diagnosis present

## 2016-08-27 LAB — BASIC METABOLIC PANEL
Anion gap: 7 (ref 5–15)
BUN: 21 mg/dL — AB (ref 6–20)
CALCIUM: 8.6 mg/dL — AB (ref 8.9–10.3)
CO2: 34 mmol/L — AB (ref 22–32)
CREATININE: 0.78 mg/dL (ref 0.44–1.00)
Chloride: 103 mmol/L (ref 101–111)
GFR calc Af Amer: >60 mL/min (ref >60)
GFR calc non Af Amer: >60 mL/min (ref >60)
GLUCOSE: 86 mg/dL (ref 65–99)
Potassium: 4 mmol/L (ref 3.5–5.1)
Sodium: 144 mmol/L (ref 135–145)

## 2016-08-27 LAB — ECHOCARDIOGRAM COMPLETE
Height: 63 in
Weight: 3065.28 oz

## 2016-08-27 MED ORDER — ALBUTEROL SULFATE (2.5 MG/3ML) 0.083% IN NEBU: 3.0000 mL | INHALATION_SOLUTION | RESPIRATORY_TRACT | Status: DC | PRN

## 2016-08-27 MED ORDER — BISACODYL 10 MG RE SUPP: 10.0000 mg | Freq: Every day | RECTAL | Status: DC | PRN

## 2016-08-27 MED ORDER — GUAIFENESIN-DM 100-10 MG/5ML PO SYRP: 10.0000 mL | ORAL_SOLUTION | ORAL | Status: DC | PRN

## 2016-08-27 MED ORDER — FLUTICASONE PROPIONATE 50 MCG/ACT NA SUSP
2.0000 | Freq: Every day | NASAL | Status: DC
  Administered 2016-08-28: 2 via NASAL
  Filled 2016-08-27: qty 16

## 2016-08-27 MED ORDER — METOPROLOL TARTRATE 25 MG PO TABS
25.0000 mg | ORAL_TABLET | Freq: Two times a day (BID) | ORAL | Status: DC
  Administered 2016-08-27 – 2016-08-28 (×3): 25 mg via ORAL
  Filled 2016-08-27 (×3): qty 1

## 2016-08-27 MED ORDER — ADULT MULTIVITAMIN W/MINERALS CH
1.0000 | ORAL_TABLET | Freq: Every day | ORAL | Status: DC
Start: 2016-08-27 — End: 2016-08-28
  Administered 2016-08-27 – 2016-08-28 (×2): 1 via ORAL
  Filled 2016-08-27 (×2): qty 1

## 2016-08-27 MED ORDER — ENSURE ENLIVE PO LIQD
237.0000 mL | Freq: Two times a day (BID) | ORAL | Status: DC
  Administered 2016-08-27 – 2016-08-28 (×3): 237 mL via ORAL

## 2016-08-27 MED ORDER — SIMETHICONE 80 MG PO CHEW: 80.0000 mg | CHEWABLE_TABLET | Freq: Four times a day (QID) | ORAL | Status: DC | PRN

## 2016-08-27 NOTE — Progress Notes (Signed)
Nutrition Education Note  RD consulted for nutrition education regarding new onset CHF.  RD provided "Low Sodium Nutrition Therapy" handout from the Academy of [employer]etics. Reviewed patient's dietary recall. Provided examples on ways to decrease sodium intake in diet. Discouraged intake of processed foods and use of salt shaker. Encouraged fresh fruits and vegetables as well as whole grain sources of carbohydrates to maximize fiber intake.   RD discussed why it is important for patient to adhere to diet recommendations, and emphasized the role of fluids, foods to avoid, and importance of weighing self daily. Teach back method used.  Expect good compliance.  Body mass index is 33.94 kg/m. Pt meets criteria for obese based on current BMI.  Current diet order is heart healthy, patient is consuming approximately 50% of meals at this time.   RD will follow this patient   Karen Espinoza Karen Marinaccio MS, RD, LDN Pager #[phone] After Hours Pager: [phone]

## 2016-08-27 NOTE — Progress Notes (Signed)
Patient ID: Karen Espinoza, female   DOB: Jun 11, 1924, 83 y.o.   MRN: [id number]    PROGRESS NOTE    Karen Espinoza  JXB:[id number] DOB: Dec 06, 1924 DOA: 08/26/2016  PCP: Manus Gunning, FNP   Brief Narrative:  Pt is 83 yo female who presented with main concern of several weeks duration of progressively worsening dyspnea, LE edema.   Assessment & Plan:   Principal Problem:   Acute hypoxemic respiratory failure (HCC) - etiology though to be related to acute CHF but unknown if systolic vs diastolic - suspect diastolic based on physical exam - pt was placed on Lasix 20 mg IV BID - weight trend since admission:  Filed Weights   08/26/16 1801 08/27/16 0603  Weight: 88.5 kg (195 lb 1.7 oz) 86.9 kg (191 lb 9.3 oz)  - keep on same regimen for now - follow up on ECHO - daily weight and strict I/O  Active Problems:   HTN, essential - continue Lisinopril - add metoprolol per home medical regimen but will lower the dose from 50 mg bid that pt takes at home to 25 mg PO BID    Dementia - continue Donepezil   DVT prophylaxis: Lovenox SQ Code Status: Full  Family Communication: Patient at bedside  Disposition Plan: to be determined   Consultants:   None  Procedures:   None  Antimicrobials:   None  Subjective: Pt reports feeling better. Less dyspnea.   Objective: Vitals:   08/26/16 1800 08/26/16 1801 08/26/16 2102 08/27/16 0603  BP: (!) 156/50  (!) 168/75 (!) 148/52  Pulse: (!) 57  62 62  Resp: 20  20 20   Temp: 98.8 F (37.1 C)  98.1 F (36.7 C) 98 F (36.7 C)  TempSrc:   Oral Oral  SpO2: (!) 89% 94% 99% 97%  Weight:  88.5 kg (195 lb 1.7 oz)  86.9 kg (191 lb 9.3 oz)  Height:  5\' 3"  (1.6 m)      Intake/Output Summary (Last 24 hours) at 08/27/16 1258 Last data filed at 08/27/16 1000  Gross per 24 hour  Intake              120 ml  Output             2000 ml  Net            -1880 ml   Filed Weights   08/26/16 1801 08/27/16 0603  Weight: 88.5  kg (195 lb 1.7 oz) 86.9 kg (191 lb 9.3 oz)    Examination:  General exam: Appears calm and comfortable  Respiratory system: Respiratory effort normal. Mild crackles at bases  Cardiovascular system: S1 & S2 heard, RRR. No JVD, rubs, gallops or clicks. +2 bilateral LE edema  Gastrointestinal system: Abdomen is nondistended, soft and nontender. No organomegaly or masses felt. Normal bowel sounds heard. Central nervous system: Alert and oriented. No focal neurological deficits.  Data Reviewed: I have personally reviewed following labs and imaging studies  CBC:  Recent Labs Lab 08/26/16 1229  WBC 10.5  NEUTROABS 8.6*  HGB 14.2  HCT 43.6  MCV 98.4  PLT 317   Basic Metabolic Panel:  Recent Labs Lab 08/26/16 1246 08/27/16 0425  NA 143 144  K 4.6 4.0  CL 102 103  CO2 33* 34*  GLUCOSE 94 86  BUN 24* 21*  CREATININE 0.82 0.78  CALCIUM 9.2 8.6*   Liver Function Tests:  Recent Labs Lab 08/26/16 1246  AST 22  ALT 25  ALKPHOS 93  BILITOT  1.4*  PROT 7.1  ALBUMIN 4.3   Coagulation Profile:  Recent Labs Lab 08/26/16 1246  INR 1.15   Cardiac Enzymes:  Recent Labs Lab 08/26/16 1246  TROPONINI <0.03   Urine analysis:    Component Value Date/Time   COLORURINE YELLOW 08/26/2016 1222   APPEARANCEUR HAZY (A) 08/26/2016 1222   LABSPEC 1.012 08/26/2016 1222   PHURINE 6.0 08/26/2016 1222   GLUCOSEU NEGATIVE 08/26/2016 1222   HGBUR NEGATIVE 08/26/2016 1222   BILIRUBINUR NEGATIVE 08/26/2016 1222   KETONESUR NEGATIVE 08/26/2016 1222   PROTEINUR NEGATIVE 08/26/2016 1222   NITRITE NEGATIVE 08/26/2016 1222   LEUKOCYTESUR TRACE (A) 08/26/2016 1222   Recent Results (from the past 240 hour(s))  MRSA PCR Screening     Status: None   Collection Time: 08/26/16  6:21 PM  Result Value Ref Range Status   MRSA by PCR NEGATIVE NEGATIVE Final    Comment:        The GeneXpert MRSA Assay (FDA approved for NASAL specimens only), is one component of a comprehensive MRSA  colonization surveillance program. It is not intended to diagnose MRSA infection nor to guide or monitor treatment for MRSA infections.     Radiology Studies: Dg Chest 2 View  Result Date: 08/26/2016 CLINICAL DATA:  Shortness of breath. EXAM: CHEST  2 VIEW COMPARISON:  No prior. FINDINGS: Borderline cardiomegaly. Low lung volumes. Mild bilateral pulmonary infiltrates/edema. Bilateral pleural effusions. No pneumothorax. IMPRESSION: Low lung volumes . Bilateral pulmonary infiltrates/edema and bilateral pleural effusions. Electronically Signed   By: Maisie Fushomas  Register   On: 08/26/2016 12:45   Scheduled Meds: . donepezil  5 mg Oral QHS  . enoxaparin (LOVENOX) injection  40 mg Subcutaneous Q24H  . furosemide  20 mg Intravenous Q12H  . gabapentin  300 mg Oral TID  . hydrocortisone  10 mg Oral Daily  . lisinopril  40 mg Oral Daily  . LORazepam  0.5 mg Oral QHS  . sertraline  50 mg Oral QHS  . sodium chloride flush  3 mL Intravenous Q12H  . sodium chloride flush  3 mL Intravenous Q12H   Continuous Infusions: . sodium chloride       LOS: 0 days   Time spent: 35 minutes   Debbora PrestoIskra Magick-Pauline Pegues, MD [facility] Pager [phone]  If 7PM-7AM, please contact night-coverage [email or web address] Password [email or web address] 08/27/2016, 12:58 PM

## 2016-08-27 NOTE — Progress Notes (Signed)
  Echocardiogram 2D Echocardiogram has been performed.  Pieter PartridgeBrooke S Pankaj Haack 08/27/2016, 8:54 AM

## 2016-08-27 NOTE — Progress Notes (Signed)
Initial Nutrition Assessment  DOCUMENTATION CODES:   Obesity unspecified  INTERVENTION:   Ensure Enlive po BID, each supplement provides 350 kcal and 20 grams of protein  NUTRITION DIAGNOSIS:   Inadequate oral intake related to acute illness as evidenced by meal completion < 50%.  GOAL:   Patient will meet greater than or equal to 90% of their needs  MONITOR:   PO intake, Supplement acceptance, Labs, Weight trends, I & O's  REASON FOR ASSESSMENT:   Consult Assessment of nutrition requirement/status  ASSESSMENT:   83 year old-year-old woman, uncomplicated past medical history, presented with increasing shortness of breath. Initial evaluation suggested acute CHF.  Met with pt in room today. Pt reports good appetite pta. Pt eating 50% of her meals currently. Per chart, pt is weight stable. Pt educated regarding heart healthy diet today.    Medications reviewed and include: lovenox, lasix  Labs reviewed: Ca 8.6(L)  Nutrition-Focused physical exam completed. Findings are no fat depletion, no muscle depletion, and mild edema.   Diet Order:  Diet Heart Room service appropriate? Yes; Fluid consistency: Thin  Skin:  Reviewed, no issues  Last BM:  7/17  Height:   Ht Readings from Last 1 Encounters:  08/26/16 5' 3"  (1.6 m)    Weight:   Wt Readings from Last 1 Encounters:  08/27/16 191 lb 9.3 oz (86.9 kg)    Ideal Body Weight:  52.3 kg  BMI:  Body mass index is 33.94 kg/m.  Estimated Nutritional Needs:   Kcal:  1600-1800kcal/day   Protein:  86-104g/day   Fluid:  per MD  EDUCATION NEEDS:   Education needs addressed  Koleen Distance MS, RD, LDN Pager #[phone] After Hours Pager: [phone]

## 2016-08-27 NOTE — Progress Notes (Signed)
Will continue to follow for HH needs.  

## 2016-08-28 LAB — BASIC METABOLIC PANEL
ANION GAP: 7 (ref 5–15)
BUN: 26 mg/dL — AB (ref 6–20)
CHLORIDE: 98 mmol/L — AB (ref 101–111)
CO2: 38 mmol/L — AB (ref 22–32)
Calcium: 8.6 mg/dL — ABNORMAL LOW (ref 8.9–10.3)
Creatinine, Ser: 0.76 mg/dL (ref 0.44–1.00)
GFR calc Af Amer: >60 mL/min (ref >60)
GFR calc non Af Amer: >60 mL/min (ref >60)
GLUCOSE: 93 mg/dL (ref 65–99)
POTASSIUM: 3.8 mmol/L (ref 3.5–5.1)
Sodium: 143 mmol/L (ref 135–145)

## 2016-08-28 MED ORDER — FUROSEMIDE 20 MG PO TABS: 20.0000 mg | ORAL_TABLET | Freq: Two times a day (BID) | ORAL | 0 refills | Status: AC

## 2016-08-28 MED ORDER — LORAZEPAM 0.5 MG PO TABS: 0.5000 mg | ORAL_TABLET | Freq: Three times a day (TID) | ORAL | 0 refills | Status: AC | PRN

## 2016-08-28 MED ORDER — METOPROLOL TARTRATE 25 MG PO TABS: 25.0000 mg | ORAL_TABLET | Freq: Two times a day (BID) | ORAL | 0 refills | Status: AC

## 2016-08-28 NOTE — Discharge Instructions (Signed)
Heart Failure °Heart failure means your heart has trouble pumping blood. This makes it hard for your body to work well. Heart failure is usually a long-term (chronic) condition. You must take good care of yourself and follow your doctor's treatment plan. °Follow these instructions at home: °· Take your heart medicine as told by your doctor. °? Do not stop taking medicine unless your doctor tells you to. °? Do not skip any dose of medicine. °? Refill your medicines before they run out. °? Take other medicines only as told by your doctor or pharmacist. °· Stay active if told by your doctor. The elderly and people with severe heart failure should talk with a doctor about physical activity. °· Eat heart-healthy foods. Choose foods that are without trans fat and are low in saturated fat, cholesterol, and salt (sodium). This includes fresh or frozen fruits and vegetables, fish, lean meats, fat-free or low-fat dairy foods, whole grains, and high-fiber foods. Lentils and dried peas and beans (legumes) are also good choices. °· Limit salt if told by your doctor. °· Cook in a healthy way. Roast, grill, broil, bake, poach, steam, or stir-fry foods. °· Limit fluids as told by your doctor. °· Weigh yourself every morning. Do this after you pee (urinate) and before you eat breakfast. Write down your weight to give to your doctor. °· Take your blood pressure and write it down if your doctor tells you to. °· Ask your doctor how to check your pulse. Check your pulse as told. °· Lose weight if told by your doctor. °· Stop smoking or chewing tobacco. Do not use gum or patches that help you quit without your doctor's approval. °· Schedule and go to doctor visits as told. °· Nonpregnant women should have no more than 1 drink a day. Men should have no more than 2 drinks a day. Talk to your doctor about drinking alcohol. °· Stop illegal drug use. °· Stay current with shots (immunizations). °· Manage your health conditions as told by your  doctor. °· Learn to manage your stress. °· Rest when you are tired. °· If it is really hot outside: °? Avoid intense activities. °? Use air conditioning or fans, or get in a cooler place. °? Avoid caffeine and alcohol. °? Wear loose-fitting, lightweight, and light-colored clothing. °· If it is really cold outside: °? Avoid intense activities. °? Layer your clothing. °? Wear mittens or gloves, a hat, and a scarf when going outside. °? Avoid alcohol. °· Learn about heart failure and get support as needed. °· Get help to maintain or improve your quality of life and your ability to care for yourself as needed. °Contact a doctor if: °· You gain weight quickly. °· You are more short of breath than usual. °· You cannot do your normal activities. °· You tire easily. °· You cough more than normal, especially with activity. °· You have any or more puffiness (swelling) in areas such as your hands, feet, ankles, or belly (abdomen). °· You cannot sleep because it is hard to breathe. °· You feel like your heart is beating fast (palpitations). °· You get dizzy or light-headed when you stand up. °Get help right away if: °· You have trouble breathing. °· There is a change in mental status, such as becoming less alert or not being able to focus. °· You have chest pain or discomfort. °· You faint. °This information is not intended to replace advice given to you by your health care provider. Make sure you   discuss any questions you have with your health care provider. °Document Released: 11/06/2007 Document Revised: 07/05/2015 Document Reviewed: 03/15/2012 °Elsevier Interactive Patient Education © 2017 [employer] Inc. ° °

## 2016-08-28 NOTE — Progress Notes (Signed)
SATURATION QUALIFICATIONS: (This note is used to comply with regulatory documentation for home oxygen)  Patient Saturations on Room Air at Rest = 97%  Patient Saturations on Room Air while Ambulating = 85%  Patient Saturations on 2 Liters of oxygen while Ambulating = 94%  Please briefly explain why patient needs home oxygen: Attempted Lasix IV prior to discharge

## 2016-08-28 NOTE — Progress Notes (Signed)
Referral for home O2 given to [facility] in house rep.

## 2016-08-28 NOTE — Clinical Social Work Note (Addendum)
Clinical Social Work Assessment  Patient Details  Name: Karen Espinoza MRN: [id number] Date of Birth: 04-15-1924  Date of referral:  08/28/16               Reason for consult:  Discharge Planning                Permission sought to share information with:  Case Manager, Facility Medical sales representativeContact Representative, Family Supports Permission granted to share information::  Yes, Verbal Permission Granted  Name::        Agency::  [facility]  Relationship::  Daughter: ArchitectHelen  Contact Information:     Housing/Transportation Living arrangements for the past 2 months:  Assisted Living Facility Source of Information:  Patient, Medical Team, Adult Children, Facility, Case Manager Patient Interpreter Needed:  None Criminal Activity/Legal Involvement Pertinent to Current Situation/Hospitalization:  No - Comment as needed Significant Relationships:  Adult Children, Other Family Members, Community Support Lives with:  Facility Resident Do you feel safe going back to the place where you live?  Yes Need for family participation in patient care:  Yes (Comment)  Care giving concerns:  LCSW discussed with daughter via phone admission and current dx.  [facility]  Daughter reports over the last week patient has been more sedentary and sitting in her easy chair most likely due to the swelling in her legs and retaining water.  Patient has her own walker at the facility and walks herself down for meals.  Daughter reports she visits each night and encourages patient to get up, but feels she was afraid due to retaining water and swelling.  Daughter is hopeful for return to ALF as patient has not been to SNF in past and has a negative outlook on SNF. Daughter feels patient will thrive with HH at facility, but if necessary they are open to SNF placement, but hopeful for return back to ALF today.  Daughter reports patient also has a cat in which daughter  is taking care of and patient is very anxious and ready to get back to cat and home.  Call placed to facility, message left for Shawn. Will update once call returned.  Social Worker assessment / plan:  Assessment completed with daughter and patient.  [facility] consult/Recommendations.  LCSW explained SNF process and insurance coverage. Discussed need for three midnight stay for insurance to cover.  Awaiting PT consult. Will update FL2 once determined.  Plan:  ?return back to ALF with HH?  Employment status:  Retired Health and safety inspectornsurance information:  Medicare PT Recommendations:  Not assessed at this time (pending) Information / Referral to community resources:  Skilled Nursing Facility  Patient/Family's Response to care:  Understanding and agreeable  Patient/Family's Understanding of and Emotional Response to Diagnosis, Current Treatment, and Prognosis:  Daughter very invested and understanding of plan for patient at this time.  Aware of current findings and treatment.  Emotional Assessment Appearance:  Appears stated age Attitude/Demeanor/Rapport:    Affect (typically observed):  Anxious, Pleasant Orientation:  Oriented to Self, Oriented to Place, Oriented to  Time, Oriented to Situation Alcohol / Substance use:  Not Applicable Psych involvement (Current and /or in the community):  No (Comment)  Discharge Needs  Concerns to be addressed:  Denies Needs/Concerns at this time Readmission within the last 30 days:  No Current discharge risk:  None Barriers to Discharge:  No Barriers Identified  Raye Sorrow, LCSW 08/28/2016, 9:09 AM

## 2016-08-28 NOTE — Progress Notes (Signed)
O2 Sats up date given to [facility].

## 2016-08-28 NOTE — Progress Notes (Signed)
Patient medically stable for discharge and LCSW confirmed with facility patient could return. Patient returning to [facility]. Daughter Myriam JacobsonHelen called LCSW and discussed dc plan..  Patient to be set up with new O2 and CM has been called to arrange. [facility]  Patient will transport by EMS back to the facility.  Fl2 updated and sent to facility along with DC summary.  Deretha EmoryHannah Markeis Allman LCSW, MSW Clinical Social Work: Optician, dispensingystem Wide Float Coverage for :  [phone]

## 2016-08-28 NOTE — Progress Notes (Signed)
LCSW has completed return to [facility]  Barrier for return at this time: O2 needs to be delivered to facility. This order has been completed by CM, but delivery will be between 5pm-9pm.  RN made aware to follow up with facility and to call EMS once delivered. Once completed, patient to transport by EMS and RN to call.  Facility called and updated regarding plan.  [phone]. Spoke with Levon HedgerKathy RN. Olegario MessierKathy to have staff call [facility] and notify when delivered.  No other needs at this time.  DC to ALF once O2 has been delivered.  Deretha EmoryHannah Lynora Dymond LCSW, MSW Clinical Social Work: Optician, dispensingystem Wide Float Coverage for :  [phone]

## 2016-08-28 NOTE — Progress Notes (Signed)
Spoke with [facility] rep, O2 delivery between 5-9 PM today.

## 2016-08-28 NOTE — [place] FL2 (Signed)
[place] MEDICAID FL2 LEVEL OF CARE SCREENING TOOL     IDENTIFICATION  Patient Name: Karen Espinoza Birthdate: 04-Aug-1924 Sex: female Admission Date (Current Location): 08/26/2016  [facility] and [place] Number:  Producer, television/film/video and Address:  [facility], [address] [place], [place] [address]      Provider Number: [id number]  Attending Physician Name and Address:  Dorothea Ogle, MD  Relative Name and Phone Number:       Current Level of Care: Hospital Recommended Level of Care: Assisted Living Facility Prior Approval Number:    Date Approved/Denied:   PASRR Number:    Discharge Plan: Other (Comment) (Return to ALF [facility])    Current Diagnoses: Patient Active Problem List   Diagnosis Date Noted  . Acute hypoxemic respiratory failure (HCC) 08/26/2016  . Acute CHF (HCC) 08/26/2016  . HTN (hypertension) 07/22/2016  . OA (osteoarthritis) 07/22/2016    Orientation RESPIRATION BLADDER Height & Weight     Self, Time, Situation, Place  O2 (2L) Continent Weight: 186 lb 8.2 oz (84.6 kg) Height:  5\' 3"  (160 cm)  BEHAVIORAL SYMPTOMS/MOOD NEUROLOGICAL BOWEL NUTRITION STATUS      Continent Diet (regular)  AMBULATORY STATUS COMMUNICATION OF NEEDS Skin   Supervision Verbally Normal                       Personal Care Assistance Level of Assistance  Bathing, Feeding, Dressing Bathing Assistance: Limited assistance Feeding assistance: Independent Dressing Assistance: Limited assistance     Functional Limitations Info  Sight, Hearing, Speech Sight Info: Adequate Hearing Info: Adequate Speech Info: Adequate    SPECIAL CARE FACTORS FREQUENCY                       Contractures Contractures Info: Not present    Additional Factors Info  Code Status, Allergies Code Status Info: Full Code Allergies Info:  Levaquin Levofloxacin In D5w, Pneumococcal Vaccines, Sulfa Antibiotics           Current Medications  (08/28/2016):  This is the current hospital active medication list Current Facility-Administered Medications  Medication Dose Route Frequency Provider Last Rate Last Dose  . 0.9 %  sodium chloride infusion  250 mL Intravenous PRN Standley Brooking, MD      . acetaminophen (TYLENOL) tablet 650 mg  650 mg Oral Q6H PRN Standley Brooking, MD       Or  . acetaminophen (TYLENOL) suppository 650 mg  650 mg Rectal Q6H PRN Standley Brooking, MD      . albuterol (PROVENTIL) (2.5 MG/3ML) 0.083% nebulizer solution 3 mL  3 mL Inhalation Q4H PRN Dorothea Ogle, MD      . bisacodyl (DULCOLAX) suppository 10 mg  10 mg Rectal Daily PRN Dorothea Ogle, MD      . donepezil (ARICEPT) tablet 5 mg  5 mg Oral QHS Standley Brooking, MD   5 mg at 08/27/16 2026  . enoxaparin (LOVENOX) injection 40 mg  40 mg Subcutaneous Q24H Standley Brooking, MD   40 mg at 08/27/16 2026  . feeding supplement (ENSURE ENLIVE) (ENSURE ENLIVE) liquid 237 mL  237 mL Oral BID BM Dorothea Ogle, MD   237 mL at 08/27/16 1400  . fluticasone (FLONASE) 50 MCG/ACT nasal spray 2 spray  2 spray Each Nare Daily Dorothea Ogle, MD      . furosemide (LASIX) injection 20 mg  20 mg Intravenous Q12H  Standley Brooking, MD   20 mg at 08/28/16 0600  . gabapentin (NEURONTIN) capsule 300 mg  300 mg Oral TID Standley Brooking, MD   300 mg at 08/28/16 1009  . guaiFENesin-dextromethorphan (ROBITUSSIN DM) 100-10 MG/5ML syrup 10 mL  10 mL Oral Q4H PRN Dorothea Ogle, MD      . hydrocortisone (CORTEF) tablet 10 mg  10 mg Oral Daily Standley Brooking, MD   10 mg at 08/27/16 1125  . lisinopril (PRINIVIL,ZESTRIL) tablet 40 mg  40 mg Oral Daily Standley Brooking, MD   40 mg at 08/28/16 1009  . LORazepam (ATIVAN) tablet 0.5 mg  0.5 mg Oral QHS Standley Brooking, MD   0.5 mg at 08/27/16 2026  . metoprolol tartrate (LOPRESSOR) tablet 25 mg  25 mg Oral BID Dorothea Ogle, MD   25 mg at 08/28/16 1009  . multivitamin with minerals tablet 1 tablet  1 tablet Oral Daily  Dorothea Ogle, MD   1 tablet at 08/28/16 1009  . polyethylene glycol (MIRALAX / GLYCOLAX) packet 17 g  17 g Oral Daily PRN Standley Brooking, MD      . sertraline (ZOLOFT) tablet 50 mg  50 mg Oral QHS Standley Brooking, MD   50 mg at 08/27/16 2026  . simethicone (MYLICON) chewable tablet 80 mg  80 mg Oral QID PRN Dorothea Ogle, MD      . sodium chloride flush (NS) 0.9 % injection 3 mL  3 mL Intravenous Q12H Standley Brooking, MD   3 mL at 08/27/16 2200  . sodium chloride flush (NS) 0.9 % injection 3 mL  3 mL Intravenous Q12H Standley Brooking, MD   3 mL at 08/27/16 2200  . sodium chloride flush (NS) 0.9 % injection 3 mL  3 mL Intravenous PRN Standley Brooking, MD         Discharge Medications: STOP taking these medications           AMBULATORY NON FORMULARY MEDICATION    ibuprofen 400 MG tablet Commonly known as:  ADVIL,MOTRIN    loperamide 2 MG tablet Commonly known as:  IMODIUM A-D    sodium phosphate 7-19 GM/118ML Enem                       TAKE these medications            acetaminophen 650 MG CR tablet Commonly known as:  TYLENOL Take 650 mg by mouth every 6 (six) hours as needed for pain.    albuterol 108 (90 Base) MCG/ACT inhaler Commonly known as:  PROVENTIL HFA;VENTOLIN HFA Inhale 2 puffs into the lungs every 4 (four) hours as needed for wheezing or shortness of breath.    bisacodyl 10 MG suppository Commonly known as:  DULCOLAX Place 10 mg rectally daily as needed for moderate constipation.    CALTRATE 600+D3 SOFT 600-800 MG-UNIT Chew Generic drug:  Calcium Carb-Cholecalciferol Chew by mouth. Take 1 chew twice daily    cetirizine 10 MG tablet Commonly known as:  ZYRTEC Take 10 mg by mouth daily.    donepezil 5 MG tablet Commonly known as:  ARICEPT Take 5 mg by mouth at bedtime.    fluticasone 50 MCG/ACT nasal spray Commonly known as:  FLONASE Place 2 sprays into both nostrils daily.    furosemide 20 MG tablet Commonly known as:   LASIX Take 1 tablet (20 mg total) by mouth 2 (two) times daily.  gabapentin 300 MG capsule Commonly known as:  NEURONTIN Take 300 mg by mouth 3 (three) times daily.    guaiFENesin-dextromethorphan 100-10 MG/5ML syrup Commonly known as:  ROBITUSSIN DM Take 10 mLs by mouth every 4 (four) hours as needed for cough.    hydrocortisone 10 MG tablet Commonly known as:  CORTEF Take 10 mg by mouth daily.    lisinopril 40 MG tablet Commonly known as:  PRINIVIL,ZESTRIL Take 40 mg by mouth daily.    LORazepam 0.5 MG tablet Commonly known as:  ATIVAN Take 1 tablet (0.5 mg total) by mouth every 8 (eight) hours as needed for anxiety or sleep. What changed:  when to take this  reasons to take this  Another medication with the same name was removed. Continue taking this medication, and follow the directions you see here.    magnesium hydroxide 400 MG/5ML suspension Commonly known as:  MILK OF MAGNESIA Take 30 mLs by mouth daily as needed for mild constipation.    metoprolol tartrate 25 MG tablet Commonly known as:  LOPRESSOR Take 1 tablet (25 mg total) by mouth 2 (two) times daily. What changed:  medication strength  how much to take    MOBIC 15 MG tablet Generic drug:  meloxicam Take 15 mg by mouth daily.    Multiple Vitamin tablet Take 1 tablet by mouth daily. Daily vite    ondansetron 4 MG tablet Commonly known as:  ZOFRAN Take 4 mg by mouth every 8 (eight) hours as needed for nausea or vomiting.    polyethylene glycol packet Commonly known as:  MIRALAX / GLYCOLAX Take 17 g by mouth daily as needed.    sertraline 50 MG tablet Commonly known as:  ZOLOFT Take 50 mg by mouth at bedtime.    simethicone 125 MG chewable tablet Commonly known as:  MYLICON Chew 125 mg by mouth every 6 (six) hours as needed for flatulence.      Relevant Imaging Results:  Relevant Lab Results:   Additional Information SSN:  [id number]   Home health at  facility.  Raye Sorrowoble, Dhruvan Gullion N, LCSW

## 2016-08-28 NOTE — Discharge Summary (Signed)
Physician Discharge Summary  Karen Espinoza YNW:[id number] DOB: 04-Oct-1924 DOA: 08/26/2016  PCP: Manus Gunning, FNP  Admit date: 08/26/2016 Discharge date: 08/28/2016  Recommendations for Outpatient Follow-up:  1. Pt will need to follow up with PCP in 1 week post discharge 2. Please obtain BMP to evaluate electrolytes and kidney function 3. Please also check CBC to evaluate Hg and Hct levels 4. Please note that dose of Metoprolol was lowered from 50 mg BID to 25 mg BID due to mild bradycardia, dose may need to be further adjusted based on HR control and BP control  5. Please note that patient was discharged on Lasix 20 mg BID PO and this regimen can be adjusted based on clinical status and BP, kidney function  6. Pt had drop in oxygen at rest and will therefore need to stay on oxygen as needed at least 2L via [place] 7. Please note the weight tend while in the hospital  [facility] Weights   08/26/16 1801 08/27/16 0603 08/28/16 0449  Weight: 88.5 kg (195 lb 1.7 oz) 86.9 kg (191 lb 9.3 oz) 84.6 kg (186 lb 8.2 oz)   Discharge Diagnoses:  Principal Problem:   Acute hypoxemic respiratory failure (HCC) Active Problems:   Acute CHF (HCC)  Discharge Condition: Stable  Diet recommendation: Heart healthy diet discussed in details   History of present illness:  Pt is 83 yo female who presented with main concern of several weeks duration of progressively worsening dyspnea, LE edema.   Assessment & Plan:   Principal Problem:   Acute hypoxemic respiratory failure (HCC) - due to acute on chronic diastolic CHF - pt was placed on Lasix 20 mg IV BID and will change to PO Lasix today, Ok for discharge to ALF vs SNF - weight trend since admission:  Filed Weights   08/26/16 1801 08/27/16 0603 08/28/16 0449  Weight: 88.5 kg (195 lb 1.7 oz) 86.9 kg (191 lb 9.3 oz) 84.6 kg (186 lb 8.2 oz)  - monitor weight on discharge   Active Problems:   HTN, essential, bradycardia - continue Lisinopril -  lowered the dose of Metoprolol from 50 mg BID to 25 mg BID due to HR in 50's    Dementia - continue Donepezil   DVT prophylaxis: Lovenox SQ Code Status: Full  Family Communication: Patient at bedside, daughter over the phone  Disposition Plan: ALF vs SNF  Consultants:   None  Procedures:   None  Antimicrobials:   None  Procedures/Studies: Dg Chest 2 View  Result Date: 08/26/2016 CLINICAL DATA:  Shortness of breath. EXAM: CHEST  2 VIEW COMPARISON:  No prior. FINDINGS: Borderline cardiomegaly. Low lung volumes. Mild bilateral pulmonary infiltrates/edema. Bilateral pleural effusions. No pneumothorax. IMPRESSION: Low lung volumes . Bilateral pulmonary infiltrates/edema and bilateral pleural effusions. Electronically Signed   By: Maisie Fus  Register   On: 08/26/2016 12:45     Discharge Exam: Vitals:   08/27/16 2028 08/28/16 0449  BP: (!) 135/51 (!) 124/58  Pulse: 62 (!) 59  Resp: 18 20  Temp: 98.1 F (36.7 C) 97.8 F (36.6 C)   Vitals:   08/27/16 1414 08/27/16 1417 08/27/16 2028 08/28/16 0449  BP: (!) 155/70  (!) 135/51 (!) 124/58  Pulse: 72  62 (!) 59  Resp: 20  18 20   Temp: 98.2 F (36.8 C)  98.1 F (36.7 C) 97.8 F (36.6 C)  TempSrc: Oral  Oral Oral  SpO2: (!) 83% 93% 97% 95%  Weight:    84.6 kg (186 lb 8.2 oz)  Height:        General: Pt is alert, follows commands appropriately, not in acute distress Cardiovascular: Regular rate and rhythm, S1/S2 +, no murmurs, no rubs, no gallops Respiratory: Clear to auscultation bilaterally, no wheezing, no crackles, no rhonchi Abdominal: Soft, non tender, non distended, bowel sounds +, no guarding Extremities: no edema, no cyanosis, pulses palpable bilaterally DP and PT Neuro: Grossly nonfocal  Discharge Instructions   Allergies as of 08/28/2016      Reactions   Levaquin [levofloxacin In D5w]    Pneumococcal Vaccines    Sulfa Antibiotics       Medication List    STOP taking these medications   AMBULATORY  NON FORMULARY MEDICATION   ibuprofen 400 MG tablet Commonly known as:  ADVIL,MOTRIN   loperamide 2 MG tablet Commonly known as:  IMODIUM A-D   sodium phosphate 7-19 GM/118ML Enem     TAKE these medications   acetaminophen 650 MG CR tablet Commonly known as:  TYLENOL Take 650 mg by mouth every 6 (six) hours as needed for pain.   albuterol 108 (90 Base) MCG/ACT inhaler Commonly known as:  PROVENTIL HFA;VENTOLIN HFA Inhale 2 puffs into the lungs every 4 (four) hours as needed for wheezing or shortness of breath.   bisacodyl 10 MG suppository Commonly known as:  DULCOLAX Place 10 mg rectally daily as needed for moderate constipation.   CALTRATE 600+D3 SOFT 600-800 MG-UNIT Chew Generic drug:  Calcium Carb-Cholecalciferol Chew by mouth. Take 1 chew twice daily   cetirizine 10 MG tablet Commonly known as:  ZYRTEC Take 10 mg by mouth daily.   donepezil 5 MG tablet Commonly known as:  ARICEPT Take 5 mg by mouth at bedtime.   fluticasone 50 MCG/ACT nasal spray Commonly known as:  FLONASE Place 2 sprays into both nostrils daily.   furosemide 20 MG tablet Commonly known as:  LASIX Take 1 tablet (20 mg total) by mouth 2 (two) times daily.   gabapentin 300 MG capsule Commonly known as:  NEURONTIN Take 300 mg by mouth 3 (three) times daily.   guaiFENesin-dextromethorphan 100-10 MG/5ML syrup Commonly known as:  ROBITUSSIN DM Take 10 mLs by mouth every 4 (four) hours as needed for cough.   hydrocortisone 10 MG tablet Commonly known as:  CORTEF Take 10 mg by mouth daily.   lisinopril 40 MG tablet Commonly known as:  PRINIVIL,ZESTRIL Take 40 mg by mouth daily.   LORazepam 0.5 MG tablet Commonly known as:  ATIVAN Take 1 tablet (0.5 mg total) by mouth every 8 (eight) hours as needed for anxiety or sleep. What changed:  when to take this  reasons to take this  Another medication with the same name was removed. Continue taking this medication, and follow the directions  you see here.   magnesium hydroxide 400 MG/5ML suspension Commonly known as:  MILK OF MAGNESIA Take 30 mLs by mouth daily as needed for mild constipation.   metoprolol tartrate 25 MG tablet Commonly known as:  LOPRESSOR Take 1 tablet (25 mg total) by mouth 2 (two) times daily. What changed:  medication strength  how much to take   MOBIC 15 MG tablet Generic drug:  meloxicam Take 15 mg by mouth daily.   Multiple Vitamin tablet Take 1 tablet by mouth daily. Daily vite   ondansetron 4 MG tablet Commonly known as:  ZOFRAN Take 4 mg by mouth every 8 (eight) hours as needed for nausea or vomiting.   polyethylene glycol packet Commonly known as:  MIRALAX /  GLYCOLAX Take 17 g by mouth daily as needed.   sertraline 50 MG tablet Commonly known as:  ZOLOFT Take 50 mg by mouth at bedtime.   simethicone 125 MG chewable tablet Commonly known as:  MYLICON Chew 125 mg by mouth every 6 (six) hours as needed for flatulence.            Durable Medical Equipment        Start     Ordered   08/28/16 1315  For home use only DME oxygen  Once    Question Answer Comment  Mode or (Route) Nasal cannula   Liters per Minute 2   Frequency Continuous (stationary and portable oxygen unit needed)   Oxygen conserving device No   Oxygen delivery system Gas      08/28/16 1314      Follow-up Information    Manus GunningMazurek, Maggie, FNP Follow up.   Specialty:  Nurse Practitioner Contact information: [address] [place] [address] [phone]            The results of significant diagnostics from this hospitalization (including imaging, microbiology, ancillary and laboratory) are listed below for reference.     Microbiology: Recent Results (from the past 240 hour(s))  Culture, blood (Routine x 2)     Status: None (Preliminary result)   Collection Time: 08/26/16 12:29 PM  Result Value Ref Range Status   Specimen Description BLOOD RIGHT ANTECUBITAL  Final   Special Requests    Final    BOTTLES DRAWN AEROBIC AND ANAEROBIC Blood Culture adequate volume   Culture   Final    NO GROWTH 2 DAYS Performed at [facility], [address] [place], [place] [address]    Report Status PENDING  Incomplete  Culture, blood (Routine x 2)     Status: None (Preliminary result)   Collection Time: 08/26/16 12:52 PM  Result Value Ref Range Status   Specimen Description BLOOD LEFT ANTECUBITAL  Final   Special Requests   Final    BOTTLES DRAWN AEROBIC AND ANAEROBIC Blood Culture adequate volume   Culture   Final    NO GROWTH 2 DAYS Performed at [facility], [address] [place], [place] [address]    Report Status PENDING  Incomplete  MRSA PCR Screening     Status: None   Collection Time: 08/26/16  6:21 PM  Result Value Ref Range Status   MRSA by PCR NEGATIVE NEGATIVE Final    Comment:        The GeneXpert MRSA Assay (FDA approved for NASAL specimens only), is one component of a comprehensive MRSA colonization surveillance program. It is not intended to diagnose MRSA infection nor to guide or monitor treatment for MRSA infections.      Labs: Basic Metabolic Panel:  Recent Labs Lab 08/26/16 1246 08/27/16 0425 08/28/16 0435  NA 143 144 143  K 4.6 4.0 3.8  CL 102 103 98*  CO2 33* 34* 38*  GLUCOSE 94 86 93  BUN 24* 21* 26*  CREATININE 0.82 0.78 0.76  CALCIUM 9.2 8.6* 8.6*   Liver Function Tests:  Recent Labs Lab 08/26/16 1246  AST 22  ALT 25  ALKPHOS 93  BILITOT 1.4*  PROT 7.1  ALBUMIN 4.3   CBC:  Recent Labs Lab 08/26/16 1229  WBC 10.5  NEUTROABS 8.6*  HGB 14.2  HCT 43.6  MCV 98.4  PLT 317   Cardiac Enzymes:  Recent Labs Lab 08/26/16 1246  TROPONINI <0.03  BNP (last 3 results)  Recent Labs  08/26/16 1229  BNP 253.8*   SIGNED: Time coordinating discharge: 50 minutes  Debbora Presto, MD  [facility] 08/28/2016, 9:52 AM Pager [phone]  If 7PM-7AM, please contact  night-coverage [email or web address] Password [email or web address]

## 2016-08-28 NOTE — Evaluation (Signed)
Physical Therapy Evaluation Patient Details Name: Karen Espinoza MRN: [id number] DOB: [birth year] Today's Date: 08/28/2016   History of Present Illness  83 yo female admitted with acute respiratory failure. Hx of obesity, OA, macular degeneration, dementia.   Clinical Impression  On eval, pt required Min assist for mobility. She walked ~75 feet with a RW. Pt c/o dizziness and fatigue. Noted some tremors during session as well. Pt stated she did not feel ready/safe to d/c today. Encouraged pt to speak with MD.  Pt presents with general weakness, decreased activity tolerance, and impaired gait and balance. If she returns to ALF, she will require increased assistance compared to her baseline. ALF would need to be agreeable with providing increased care. If ALF is unable to provide current level of assist, pt may need ST SNF. O2 sats 87% on RA during activity, 90% on RA at rest.      Follow Up Recommendations Home health PT; 24 hour supervision/assist (at ALF as long as facility can provide increased level of assistance. If ALF is unable, then pt will require SNF)    Equipment Recommendations  None recommended by PT    Recommendations for Other Services       Precautions / Restrictions Precautions Precautions: Fall Precaution Comments: monitor O2 sats Restrictions Weight Bearing Restrictions: No      Mobility  Bed Mobility Overal bed mobility: Needs Assistance Bed Mobility: Supine to Sit     Supine to sit: Min guard;HOB elevated     General bed mobility comments: Increased time. Pt fatigues easily.   Transfers Overall transfer level: Needs assistance Equipment used: Rolling walker (2 wheeled) Transfers: Sit to/from Stand Sit to Stand: Min guard;Min assist         General transfer comment: close guard for safety. Increased time. Min assist to rise from bed. Min guard assist to rise from recliner, BSC. Stand pivot, recliner to bsc, with  rW  Ambulation/Gait Ambulation/Gait assistance: Min assist Ambulation Distance (Feet): 75 Feet Assistive device: Rolling walker (2 wheeled) Gait Pattern/deviations: Step-through pattern;Decreased stride length;Trunk flexed     General Gait Details: Assist to stabilize throughout ambulation distance. Pt fatigues easily. She c/o dizziness. O2 sats dropped to 87% on RA (saturation reading difficult to maintain).   Stairs            Wheelchair Mobility    Modified Rankin (Stroke Patients Only)       Balance Overall balance assessment: Needs assistance         Standing balance support: Bilateral upper extremity supported Standing balance-Leahy Scale: Poor Standing balance comment: requires RW                             Pertinent Vitals/Pain Pain Assessment: No/denies pain    Home Living Family/patient expects to be discharged to:: Assisted living               Home Equipment: Dan Humphreys - 2 wheels      Prior Function     Gait / Transfers Assistance Needed: uses RW for ambulation. walks to dining room           Hand Dominance        Extremity/Trunk Assessment   Upper Extremity Assessment Upper Extremity Assessment: Generalized weakness    Lower Extremity Assessment Lower Extremity Assessment: Generalized weakness    Cervical / Trunk Assessment Cervical / Trunk Assessment: Kyphotic  Communication   Communication: No difficulties  Cognition Arousal/Alertness: Awake/alert  Behavior During Therapy: WFL for tasks assessed/performed Overall Cognitive Status: Within Functional Limits for tasks assessed                                        General Comments      Exercises     Assessment/Plan    PT Assessment Patient needs continued PT services  PT Problem List Decreased strength;Decreased mobility;Decreased activity tolerance;Decreased balance;Decreased knowledge of use of DME       PT Treatment Interventions  DME instruction;Therapeutic activities;Gait training;Therapeutic exercise;Patient/family education;Functional mobility training;Balance training    PT Goals (Current goals can be found in the Care Plan section)  Acute Rehab PT Goals Patient Stated Goal: to get stronger PT Goal Formulation: With patient Time For Goal Achievement: 09/11/16 Potential to Achieve Goals: Good    Frequency Min 3X/week   Barriers to discharge        Co-evaluation               AM-PAC PT "6 Clicks" Daily Activity  Outcome Measure Difficulty turning over in bed (including adjusting bedclothes, sheets and blankets)?: A Lot Difficulty moving from lying on back to sitting on the side of the bed? : A Lot Difficulty sitting down on and standing up from a chair with arms (e.g., wheelchair, bedside commode, etc,.)?: Total Help needed moving to and from a bed to chair (including a wheelchair)?: A Little Help needed walking in hospital room?: A Little Help needed climbing 3-5 steps with a railing? : A Lot 6 Click Score: 13    End of Session Equipment Utilized During Treatment: Gait belt;Oxygen Activity Tolerance: Patient limited by fatigue Patient left: in chair;with call bell/phone within reach;with chair alarm set   PT Visit Diagnosis: Muscle weakness (generalized) (M62.81);Difficulty in walking, not elsewhere classified (R26.2)    Time: [id number] PT Time Calculation (min) (ACUTE ONLY): 42 min   Charges:   PT Evaluation $PT Eval Low Complexity: 1 Procedure PT Treatments $Gait Training: 8-22 mins $Therapeutic Activity: 8-22 mins   PT G Codes:          Rebeca AlertJannie Tinamarie Przybylski, MPT Pager: [phone]

## 2016-08-31 LAB — CULTURE, BLOOD (ROUTINE X 2)
CULTURE: NO GROWTH
CULTURE: NO GROWTH
Special Requests: ADEQUATE
Special Requests: ADEQUATE

## 2016-12-04 ENCOUNTER — Encounter (HOSPITAL_COMMUNITY): Payer: Self-pay | Admitting: Family Medicine

## 2016-12-04 ENCOUNTER — Inpatient Hospital Stay (HOSPITAL_COMMUNITY)
Admission: EM | Admit: 2016-12-04 | Discharge: 2016-12-11 | DRG: 871 | Disposition: E | Payer: Medicare Other | Attending: Internal Medicine | Admitting: Internal Medicine

## 2016-12-04 ENCOUNTER — Emergency Department (HOSPITAL_COMMUNITY): Payer: Medicare Other

## 2016-12-04 DIAGNOSIS — R6521 Severe sepsis with septic shock: Secondary | ICD-10-CM | POA: Diagnosis present

## 2016-12-04 DIAGNOSIS — Z515 Encounter for palliative care: Secondary | ICD-10-CM | POA: Diagnosis present

## 2016-12-04 DIAGNOSIS — J9601 Acute respiratory failure with hypoxia: Secondary | ICD-10-CM | POA: Diagnosis present

## 2016-12-04 DIAGNOSIS — I248 Other forms of acute ischemic heart disease: Secondary | ICD-10-CM | POA: Diagnosis present

## 2016-12-04 DIAGNOSIS — I499 Cardiac arrhythmia, unspecified: Secondary | ICD-10-CM

## 2016-12-04 DIAGNOSIS — Y95 Nosocomial condition: Secondary | ICD-10-CM | POA: Diagnosis present

## 2016-12-04 DIAGNOSIS — Z9049 Acquired absence of other specified parts of digestive tract: Secondary | ICD-10-CM

## 2016-12-04 DIAGNOSIS — I4510 Unspecified right bundle-branch block: Secondary | ICD-10-CM | POA: Diagnosis present

## 2016-12-04 DIAGNOSIS — F0390 Unspecified dementia without behavioral disturbance: Secondary | ICD-10-CM | POA: Diagnosis present

## 2016-12-04 DIAGNOSIS — Z79899 Other long term (current) drug therapy: Secondary | ICD-10-CM

## 2016-12-04 DIAGNOSIS — Z90710 Acquired absence of both cervix and uterus: Secondary | ICD-10-CM

## 2016-12-04 DIAGNOSIS — E669 Obesity, unspecified: Secondary | ICD-10-CM | POA: Diagnosis present

## 2016-12-04 DIAGNOSIS — J690 Pneumonitis due to inhalation of food and vomit: Secondary | ICD-10-CM | POA: Diagnosis present

## 2016-12-04 DIAGNOSIS — I509 Heart failure, unspecified: Secondary | ICD-10-CM | POA: Diagnosis not present

## 2016-12-04 DIAGNOSIS — I110 Hypertensive heart disease with heart failure: Secondary | ICD-10-CM | POA: Diagnosis present

## 2016-12-04 DIAGNOSIS — G9341 Metabolic encephalopathy: Secondary | ICD-10-CM | POA: Diagnosis present

## 2016-12-04 DIAGNOSIS — Z882 Allergy status to sulfonamides status: Secondary | ICD-10-CM

## 2016-12-04 DIAGNOSIS — N179 Acute kidney failure, unspecified: Secondary | ICD-10-CM | POA: Diagnosis present

## 2016-12-04 DIAGNOSIS — F329 Major depressive disorder, single episode, unspecified: Secondary | ICD-10-CM | POA: Diagnosis present

## 2016-12-04 DIAGNOSIS — Z6831 Body mass index (BMI) 31.0-31.9, adult: Secondary | ICD-10-CM | POA: Diagnosis not present

## 2016-12-04 DIAGNOSIS — E872 Acidosis: Secondary | ICD-10-CM | POA: Diagnosis present

## 2016-12-04 DIAGNOSIS — M1990 Unspecified osteoarthritis, unspecified site: Secondary | ICD-10-CM | POA: Diagnosis present

## 2016-12-04 DIAGNOSIS — R4182 Altered mental status, unspecified: Secondary | ICD-10-CM | POA: Diagnosis present

## 2016-12-04 DIAGNOSIS — G9340 Encephalopathy, unspecified: Secondary | ICD-10-CM | POA: Diagnosis not present

## 2016-12-04 DIAGNOSIS — E875 Hyperkalemia: Secondary | ICD-10-CM | POA: Diagnosis present

## 2016-12-04 DIAGNOSIS — F419 Anxiety disorder, unspecified: Secondary | ICD-10-CM | POA: Diagnosis present

## 2016-12-04 DIAGNOSIS — Z86010 Personal history of colonic polyps: Secondary | ICD-10-CM

## 2016-12-04 DIAGNOSIS — Z791 Long term (current) use of non-steroidal anti-inflammatories (NSAID): Secondary | ICD-10-CM

## 2016-12-04 DIAGNOSIS — A419 Sepsis, unspecified organism: Secondary | ICD-10-CM | POA: Diagnosis present

## 2016-12-04 DIAGNOSIS — H3530 Unspecified macular degeneration: Secondary | ICD-10-CM | POA: Diagnosis present

## 2016-12-04 DIAGNOSIS — Z66 Do not resuscitate: Secondary | ICD-10-CM | POA: Diagnosis present

## 2016-12-04 DIAGNOSIS — R001 Bradycardia, unspecified: Secondary | ICD-10-CM | POA: Diagnosis present

## 2016-12-04 DIAGNOSIS — K589 Irritable bowel syndrome without diarrhea: Secondary | ICD-10-CM | POA: Diagnosis present

## 2016-12-04 DIAGNOSIS — Z7952 Long term (current) use of systemic steroids: Secondary | ICD-10-CM

## 2016-12-04 DIAGNOSIS — I5033 Acute on chronic diastolic (congestive) heart failure: Secondary | ICD-10-CM | POA: Diagnosis present

## 2016-12-04 DIAGNOSIS — Z887 Allergy status to serum and vaccine status: Secondary | ICD-10-CM

## 2016-12-04 DIAGNOSIS — D72829 Elevated white blood cell count, unspecified: Secondary | ICD-10-CM

## 2016-12-04 DIAGNOSIS — J9602 Acute respiratory failure with hypercapnia: Secondary | ICD-10-CM | POA: Diagnosis present

## 2016-12-04 DIAGNOSIS — Z881 Allergy status to other antibiotic agents status: Secondary | ICD-10-CM

## 2016-12-04 DIAGNOSIS — Z7951 Long term (current) use of inhaled steroids: Secondary | ICD-10-CM

## 2016-12-04 DIAGNOSIS — R0602 Shortness of breath: Secondary | ICD-10-CM

## 2016-12-04 LAB — BRAIN NATRIURETIC PEPTIDE: B Natriuretic Peptide: 45.5 pg/mL (ref 0.0–100.0)

## 2016-12-04 LAB — COMPREHENSIVE METABOLIC PANEL
ALBUMIN: 3.3 g/dL — AB (ref 3.5–5.0)
ALK PHOS: 127 U/L — AB (ref 38–126)
ALT: 28 U/L (ref 14–54)
AST: 72 U/L — ABNORMAL HIGH (ref 15–41)
Anion gap: 21 — ABNORMAL HIGH (ref 5–15)
BUN: 51 mg/dL — ABNORMAL HIGH (ref 6–20)
CALCIUM: 8.6 mg/dL — AB (ref 8.9–10.3)
CO2: 18 mmol/L — AB (ref 22–32)
CREATININE: 2.59 mg/dL — AB (ref 0.44–1.00)
Chloride: 102 mmol/L (ref 101–111)
GFR calc Af Amer: 18 mL/min — ABNORMAL LOW (ref >60)
GFR calc non Af Amer: 15 mL/min — ABNORMAL LOW (ref >60)
GLUCOSE: 265 mg/dL — AB (ref 65–99)
Potassium: 5.4 mmol/L — ABNORMAL HIGH (ref 3.5–5.1)
SODIUM: 141 mmol/L (ref 135–145)
Total Bilirubin: 0.8 mg/dL (ref 0.3–1.2)
Total Protein: 5.9 g/dL — ABNORMAL LOW (ref 6.5–8.1)

## 2016-12-04 LAB — I-STAT CHEM 8, ED
BUN: 52 mg/dL — ABNORMAL HIGH (ref 6–20)
CREATININE: 2.3 mg/dL — AB (ref 0.44–1.00)
Calcium, Ion: 1.06 mmol/L — ABNORMAL LOW (ref 1.15–1.40)
Chloride: 104 mmol/L (ref 101–111)
GLUCOSE: 264 mg/dL — AB (ref 65–99)
HCT: 41 % (ref 36.0–46.0)
HEMOGLOBIN: 13.9 g/dL (ref 12.0–15.0)
POTASSIUM: 5 mmol/L (ref 3.5–5.1)
Sodium: 139 mmol/L (ref 135–145)
TCO2: 21 mmol/L — AB (ref 22–32)

## 2016-12-04 LAB — I-STAT ARTERIAL BLOOD GAS, ED
Acid-base deficit: 11 mmol/L — ABNORMAL HIGH (ref 0.0–2.0)
BICARBONATE: 19 mmol/L — AB (ref 20.0–28.0)
O2 Saturation: 98 %
PO2 ART: 134 mmHg — AB (ref 83.0–108.0)
TCO2: 21 mmol/L — AB (ref 22–32)
pCO2 arterial: 61 mmHg — ABNORMAL HIGH (ref 32.0–48.0)
pH, Arterial: 7.102 — CL (ref 7.350–7.450)

## 2016-12-04 LAB — PROCALCITONIN: Procalcitonin: <0.1 ng/mL

## 2016-12-04 LAB — CBC WITH DIFFERENTIAL/PLATELET
Basophils Absolute: 0.1 10*3/uL (ref 0.0–0.1)
Basophils Relative: 0 %
EOS ABS: 0.3 10*3/uL (ref 0.0–0.7)
EOS PCT: 1 %
HCT: 42.8 % (ref 36.0–46.0)
Hemoglobin: 13.2 g/dL (ref 12.0–15.0)
LYMPHS ABS: 4 10*3/uL (ref 0.7–4.0)
Lymphocytes Relative: 23 %
MCH: 31.3 pg (ref 26.0–34.0)
MCHC: 30.8 g/dL (ref 30.0–36.0)
MCV: 101.4 fL — ABNORMAL HIGH (ref 78.0–100.0)
Monocytes Absolute: 0.9 10*3/uL (ref 0.1–1.0)
Monocytes Relative: 5 %
Neutro Abs: 12.5 10*3/uL — ABNORMAL HIGH (ref 1.7–7.7)
Neutrophils Relative %: 71 %
PLATELETS: 229 10*3/uL (ref 150–400)
RBC: 4.22 MIL/uL (ref 3.87–5.11)
RDW: 16.1 % — ABNORMAL HIGH (ref 11.5–15.5)
WBC: 17.7 10*3/uL — ABNORMAL HIGH (ref 4.0–10.5)

## 2016-12-04 LAB — TRIGLYCERIDES: Triglycerides: 107 mg/dL (ref <150)

## 2016-12-04 LAB — PROTIME-INR
INR: 1.92
PROTHROMBIN TIME: 21.8 s — AB (ref 11.4–15.2)

## 2016-12-04 LAB — MAGNESIUM: MAGNESIUM: 2.3 mg/dL (ref 1.7–2.4)

## 2016-12-04 LAB — TROPONIN I: Troponin I: <0.03 ng/mL (ref <0.03)

## 2016-12-04 LAB — LACTIC ACID, PLASMA: LACTIC ACID, VENOUS: 4.2 mmol/L — AB (ref 0.5–1.9)

## 2016-12-04 MED ORDER — SODIUM CHLORIDE 0.9 % IV SOLN
INTRAVENOUS | Status: DC
  Administered 2016-12-04: 23:00:00 via INTRAVENOUS

## 2016-12-04 MED ORDER — MIDAZOLAM HCL 2 MG/2ML IJ SOLN
2.0000 mg | Freq: Once | INTRAMUSCULAR | Status: AC
  Administered 2016-12-04: 2 mg via INTRAVENOUS

## 2016-12-04 MED ORDER — GABAPENTIN 300 MG PO CAPS
300.0000 mg | ORAL_CAPSULE | Freq: Three times a day (TID) | ORAL | Status: DC
  Administered 2016-12-04: 300 mg via ORAL
  Filled 2016-12-04: qty 1

## 2016-12-04 MED ORDER — ALBUTEROL SULFATE (2.5 MG/3ML) 0.083% IN NEBU: 2.5000 mg | INHALATION_SOLUTION | RESPIRATORY_TRACT | Status: DC | PRN

## 2016-12-04 MED ORDER — VANCOMYCIN HCL IN DEXTROSE 1-5 GM/200ML-% IV SOLN
1000.0000 mg | INTRAVENOUS | Status: DC
  Administered 2016-12-05: 1000 mg via INTRAVENOUS
  Filled 2016-12-04: qty 200

## 2016-12-04 MED ORDER — MIDAZOLAM HCL 2 MG/2ML IJ SOLN
INTRAMUSCULAR | Status: AC
  Filled 2016-12-04: qty 2

## 2016-12-04 MED ORDER — ONDANSETRON HCL 4 MG PO TABS: 4.0000 mg | ORAL_TABLET | Freq: Four times a day (QID) | ORAL | Status: DC | PRN

## 2016-12-04 MED ORDER — LORAZEPAM 0.5 MG PO TABS
0.5000 mg | ORAL_TABLET | Freq: Three times a day (TID) | ORAL | Status: DC | PRN
  Administered 2016-12-04: 0.5 mg via ORAL
  Filled 2016-12-04: qty 1

## 2016-12-04 MED ORDER — MORPHINE SULFATE (PF) 2 MG/ML IV SOLN
2.0000 mg | INTRAVENOUS | Status: DC | PRN
  Filled 2016-12-04: qty 1

## 2016-12-04 MED ORDER — IOPAMIDOL (ISOVUE-300) INJECTION 61%
INTRAVENOUS | Status: AC
  Administered 2016-12-05
  Filled 2016-12-04: qty 30

## 2016-12-04 MED ORDER — DONEPEZIL HCL 5 MG PO TABS
5.0000 mg | ORAL_TABLET | Freq: Every day | ORAL | Status: DC
  Administered 2016-12-04: 5 mg via ORAL
  Filled 2016-12-04: qty 1

## 2016-12-04 MED ORDER — PROPOFOL 1000 MG/100ML IV EMUL
5.0000 ug/kg/min | INTRAVENOUS | Status: DC
  Administered 2016-12-04: 5 ug/kg/min via INTRAVENOUS

## 2016-12-04 MED ORDER — SODIUM CHLORIDE 0.9 % IV BOLUS (SEPSIS)
1000.0000 mL | Freq: Once | INTRAVENOUS | Status: AC
  Administered 2016-12-04: 1000 mL via INTRAVENOUS

## 2016-12-04 MED ORDER — ONDANSETRON HCL 4 MG/2ML IJ SOLN
4.0000 mg | Freq: Once | INTRAMUSCULAR | Status: AC
  Administered 2016-12-04: 4 mg via INTRAVENOUS
  Filled 2016-12-04: qty 2

## 2016-12-04 MED ORDER — ONDANSETRON HCL 4 MG/2ML IJ SOLN: 4.0000 mg | Freq: Four times a day (QID) | INTRAMUSCULAR | Status: DC | PRN

## 2016-12-04 MED ORDER — PIPERACILLIN-TAZOBACTAM 3.375 G IVPB
3.3750 g | Freq: Three times a day (TID) | INTRAVENOUS | Status: DC
  Administered 2016-12-05 (×2): 3.375 g via INTRAVENOUS
  Filled 2016-12-04 (×3): qty 50

## 2016-12-04 MED ORDER — PANTOPRAZOLE SODIUM 40 MG IV SOLR
40.0000 mg | Freq: Once | INTRAVENOUS | Status: AC
  Administered 2016-12-04: 40 mg via INTRAVENOUS
  Filled 2016-12-04: qty 40

## 2016-12-04 NOTE — Progress Notes (Signed)
Pharmacy Antibiotic Note  Remi HaggardJacqueline Deckman is a 83 y.o. female admitted on 12/09/2016 with respiratory failure.  Pharmacy has been consulted for Vancomycin/Zosyn dosing. WBC elevated at 17.7. Noted renal dysfunction. Lactic acid elevated.   Plan: Vancomycin 1000 mg IV q48h Zosyn 3.375G IV q8h  Trend WBC, temp, renal function  F/U infectious work-up Drug levels as indicated   Height: 5\' 3"  (160 cm) Weight: 189 lb 3.2 oz (85.8 kg) IBW/kg (Calculated) : 52.4  Temp (24hrs), Avg:97.8 F (36.6 C), Min:97 F (36.1 C), Max:98.6 F (37 C)   Recent Labs Lab 11/19/2016 1738 12/02/2016 1750 11/12/2016 2128  WBC 17.7*  --   --   CREATININE 2.59* 2.30*  --   LATICACIDVEN  --   --  4.2*    Estimated Creatinine Clearance: 16.9 mL/min (A) (by C-G formula based on SCr of 2.3 mg/dL (H)).    Allergies  Allergen Reactions  . Levaquin [Levofloxacin In D5w]   . Pneumococcal Vaccines   . Sulfa Antibiotics    Abran DukeLedford, Leslyn Monda 12/05/2016 11:37 PM

## 2016-12-04 NOTE — ED Notes (Signed)
Pt verbalized pain in the upper abdomen, small bouts of emesis.  Resident informed

## 2016-12-04 NOTE — ED Notes (Signed)
Spoke to Dr. Onalee Huaavid regarding lactic acid and three loose bowel movements.  She asked RN to put order in for c.diff test and send pt to the floor.

## 2016-12-04 NOTE — ED Notes (Signed)
Primary RN aware of elevated lactic acid

## 2016-12-04 NOTE — ED Provider Notes (Signed)
I saw and evaluated the patient, reviewed the resident's note and I agree with the findings and plan.  Pertinent History: The patient is a 83 year old female, extended care facility where she currently resides with a known history of congestive heart failure hypertension, history of anxiety disorder macular degeneration, and a history of having a DO NOT RESUSCITATE.  The patient was not seen when she had her unwitnessed episode of unresponsiveness.  Evidently the patient had screamed out, when someone went in the room she was slumped over in her recliner, heart rate in the 30s, breathing approximately 8 breaths/min.  The patient was unable to give any information, level 5 caveat applies.  The paramedics initially found the patient to be severely bradycardic, they assisted with respirations and oxygenation and had some improvement in her heart rate.  They then intubated the patient for airway protection but deciding that she had neither had a cardiac nor pulmonary arrest but this was only assistive.  The patient was still unresponsive in route to the hospital.  She never required CPR.  Her heart rate improved to the 70s.   Pertinent Exam findings: On exam the patient is unresponsive, she is breathing over the ventilator, she is chewing a little bit on the endotracheal tube, her pupils are small, 1-2 mm bilaterally, she has a corneal reflex but does not have any pain reflex, she does not correlate pain with withdrawal of the extremities.  She seems to have no seizure activity.  Abdomen is soft, nondistended, heart rate is in the 60s-70s occasionally in the 50s, pulses are weak, no edema, normal lung sounds with assisted respirations.  Neurologic exam is such that the patient does not have any spontaneous movements, does not follow commands, does not open her eyes to sound.   The patient was placed on the medical gurney, she was attached to the cardiac monitor and the defibrillator, she was hooked up to the  ventilator to assist with respirations awaiting family's arrival.  Will obtain a chest x-ray, CT scan of the brain, labs, EKG for further evaluation   EKG Interpretation  Date/Time:  Thursday December 04 2016 19:16:55 EDT Ventricular Rate:  83 PR Interval:    QRS Duration: 126 QT Interval:  387 QTC Calculation: 455 R Axis:   34 Text Interpretation:  Sinus rhythm Right bundle branch block Borderline ST elevation, lateral leads Since last tracing ecotpy has resolved and the rhythm is no longer junctional Confirmed by Eber HongMiller, Kyng Matlock ([id number]) on 2016/04/04 7:21:02 PM      This is the repeat EKG, the initial EKG which was performed on arrival showed that the patient was in a junctional abnormal rhythm with ectopy present.  Labs show elevated creatinine with an acute kidney injury, leukocytosis and a respiratory acidosis based on the patient's ABG.  If the x-ray did not show any definitive signs of causation, the CT scan of the brain neither.  The patient gradually improved in her mental status and on the ventilator required some Versed.  She continued to be restless and agitated and finally was able to open her eyes follow commands and requested to be taken out of her mouth.  I did discuss with her family her wishes and they stated that she did not want to be placed on a ventilator but wished that that would stay while we were working her up.  At this time as this is the patient's wishes and she was able to tell me to take the tube out we did  extubate the patient.  This was done successfully at approximately 7:15 PM.  She maintained her airway for an additional hour prior to admisison call - CT head and labs pending - now show AKI and acidosis.    Family arrived and were updated on the plan.  The patient will need to be admitted to the hospital.  We are still unclear exactly what happened, this could have been an arrhythmia, she was to have a cardiac monitor.   I was personally present and directly  supervised the following procedures:  Medical resuscitation Extubation  I discussed case with Dr. Onalee Hua who will admit to hospital  I personally interpreted the EKG as well as the resident and agree with the interpretation on the resident's chart.  Final diagnoses:  Acute encephalopathy  Cardiac arrhythmia, unspecified cardiac arrhythmia type  AKI (acute kidney injury) (HCC)  Leukocytosis, unspecified type    Physical Exam  There were no vitals taken for this visit.  Physical Exam  ED Course  .Critical Care Performed by: Eber Hong Authorized by: Eber Hong   Critical care provider statement:    Critical care time (minutes):  35   Critical care time was exclusive of:  Separately billable procedures and treating other patients and teaching time   Critical care was necessary to treat or prevent imminent or life-threatening deterioration of the following conditions:  Respiratory failure   Critical care was time spent personally by me on the following activities:  Blood draw for specimens, development of treatment plan with patient or surrogate, discussions with consultants, evaluation of patient's response to treatment, examination of patient, obtaining history from patient or surrogate, ordering and performing treatments and interventions, ordering and review of laboratory studies, ordering and review of radiographic studies, re-evaluation of patient's condition, pulse oximetry, review of old charts and vascular access procedures           Eber Hong, MD 2017/01/02 1635

## 2016-12-04 NOTE — ED Notes (Signed)
Propofol started at 1736 at 2125mcg/kg/min, pt then became hypotensive at 69/32. Propofol stopped immediately. BP improved with fluids.

## 2016-12-04 NOTE — ED Provider Notes (Signed)
[facility] EMERGENCY DEPARTMENT Provider Note   CSN: [id number] Arrival date & time: 12/09/2016  1730  History   Chief Complaint Chief Complaint  Patient presents with  . Altered Mental Status   HPI Karen Espinoza is a 83 y.o. female with PMH of CHF who presented via EMS after she was found unresponsive in her assisted living facility. Reportedly staff heard the pain scream out in pain and when they went to check on her she was slumped over and unresponsive. When EMS arrived they found the patient bradycardic and unresponsive. EMS provided assisted breathes with improvement in the patients heart rate however proceeded forward with intubation for airway protection as she remained unresponsive.   The history is provided by the EMS personnel.  Altered Mental Status   This is a new problem. The current episode started less than 1 hour ago. Associated symptoms include unresponsiveness. Her past medical history is significant for hypertension and depression.   Past Medical History:  Diagnosis Date  . Anxiety   . Colon polyps   . Depression   . Diverticulosis   . IBS (irritable bowel syndrome)   . Macular degeneration   . Obesity   . Osteoarthritis    Patient Active Problem List   Diagnosis Date Noted  . Acute hypoxemic respiratory failure (HCC) 08/26/2016  . Acute CHF (HCC) 08/26/2016  . HTN (hypertension) 07/22/2016  . OA (osteoarthritis) 07/22/2016   Past Surgical History:  Procedure Laterality Date  . ABDOMINAL HYSTERECTOMY    . BRAIN SURGERY     benign tumor  . colonscopy    . GALLBLADDER SURGERY    . NASAL SINUS SURGERY     OB History    No data available     Home Medications    Prior to Admission medications   Medication Sig Start Date End Date Taking? Authorizing Provider  acetaminophen (TYLENOL) 650 MG CR tablet Take 650 mg by mouth every 6 (six) hours as needed for pain.    [provider]  albuterol (PROVENTIL HFA;VENTOLIN  HFA) 108 (90 Base) MCG/ACT inhaler Inhale 2 puffs into the lungs every 4 (four) hours as needed for wheezing or shortness of breath.    [provider]  bisacodyl (DULCOLAX) 10 MG suppository Place 10 mg rectally daily as needed for moderate constipation.    [provider]  Calcium Carb-Cholecalciferol (CALTRATE 600+D3 SOFT) 600-800 MG-UNIT CHEW Chew by mouth. Take 1 chew twice daily    [provider]  cetirizine (ZYRTEC) 10 MG tablet Take 10 mg by mouth daily.    [provider]  donepezil (ARICEPT) 5 MG tablet Take 5 mg by mouth at bedtime.    [provider]  fluticasone (FLONASE) 50 MCG/ACT nasal spray Place 2 sprays into both nostrils daily.    [provider]  furosemide (LASIX) 20 MG tablet Take 1 tablet (20 mg total) by mouth 2 (two) times daily. 08/28/16 08/28/17  Dorothea Ogle, MD  gabapentin (NEURONTIN) 300 MG capsule Take 300 mg by mouth 3 (three) times daily.    [provider]  guaiFENesin-dextromethorphan (ROBITUSSIN DM) 100-10 MG/5ML syrup Take 10 mLs by mouth every 4 (four) hours as needed for cough.    [provider]  hydrocortisone (CORTEF) 10 MG tablet Take 10 mg by mouth daily.    [provider]  lisinopril (PRINIVIL,ZESTRIL) 40 MG tablet Take 40 mg by mouth daily.    [provider]  LORazepam (ATIVAN) 0.5 MG tablet Take 1  tablet (0.5 mg total) by mouth every 8 (eight) hours as needed for anxiety or sleep. 08/28/16   Dorothea OgleMyers, Iskra M, MD  magnesium hydroxide (MILK OF MAGNESIA) 400 MG/5ML suspension Take 30 mLs by mouth daily as needed for mild constipation.    [provider]  meloxicam (MOBIC) 15 MG tablet Take 15 mg by mouth daily.    [provider]  metoprolol tartrate (LOPRESSOR) 25 MG tablet Take 1 tablet (25 mg total) by mouth 2 (two) times daily. 08/28/16   Dorothea OgleMyers, Iskra M, MD  Multiple Vitamin tablet Take 1 tablet by mouth daily. Daily vite    [provider]  ondansetron (ZOFRAN) 4 MG tablet Take 4 mg by mouth every 8 (eight) hours as needed for nausea or vomiting.    [provider]  polyethylene glycol (MIRALAX / GLYCOLAX) packet Take 17 g by mouth daily as needed.    [provider]  sertraline (ZOLOFT) 50 MG tablet Take 50 mg by mouth at bedtime.    [provider]  simethicone (MYLICON) 125 MG chewable tablet Chew 125 mg by mouth every 6 (six) hours as needed for flatulence.    [provider]    Family History Family History  Problem Relation Age of Onset  . Family history unknown: Yes   Social History Social History  Substance Use Topics  . Smoking status: Never Smoker  . Smokeless tobacco: Never Used  . Alcohol use No   Allergies   Levaquin [levofloxacin in d5w]; Pneumococcal vaccines; and Sulfa antibiotics  Review of Systems Review of Systems  Unable to perform ROS: Mental status change   Physical Exam Updated Vital Signs BP (!) 94/33   Pulse 78   Temp (!) 97 F (36.1 C) (Temporal)   Resp (!) 33   SpO2 98%   Physical Exam  Constitutional: She appears well-developed and well-nourished. She appears distressed. She is intubated.  HENT:  Head: Normocephalic and atraumatic.  Eyes: Conjunctivae are normal.  Pupils pin point bilaterall Corneal reflex present  Neck: Neck supple.  Cardiovascular: Normal rate and regular rhythm.   No murmur heard. Pulmonary/Chest: Effort normal and breath sounds normal. She is intubated. No respiratory distress.  Abdominal: Soft. She exhibits no distension.  Musculoskeletal: She exhibits no edema or deformity.  Neurological: She is unresponsive.  Non-responsive to painful stimuli Cough and gag reflex present  Skin: Skin is warm and dry.  Psychiatric: She has a normal mood and affect.  Nursing note and vitals reviewed.  ED Treatments / Results  Labs (all labs ordered are listed, but only abnormal results are displayed) Labs Reviewed  CBC WITH  DIFFERENTIAL/PLATELET - Abnormal; Notable for the following:       Result Value   WBC 17.7 (*)    MCV 101.4 (*)    RDW 16.1 (*)    Neutro Abs 12.5 (*)    All other components within normal limits  PROTIME-INR - Abnormal; Notable for the following:    Prothrombin Time 21.8 (*)    All other components within normal limits  I-STAT CHEM 8, ED - Abnormal; Notable for the following:    BUN 52 (*)    Creatinine, Ser 2.30 (*)    Glucose, Bld 264 (*)    Calcium, Ion 1.06 (*)    TCO2 21 (*)    All other components within normal limits  I-STAT ARTERIAL BLOOD GAS, ED - Abnormal; Notable for the following:    pH, Arterial 7.102 (*)  pCO2 arterial 61.0 (*)    pO2, Arterial 134.0 (*)    Bicarbonate 19.0 (*)    TCO2 21 (*)    Acid-base deficit 11.0 (*)    All other components within normal limits  COMPREHENSIVE METABOLIC PANEL  TROPONIN I  URINALYSIS, ROUTINE W REFLEX MICROSCOPIC  BRAIN NATRIURETIC PEPTIDE  TRIGLYCERIDES    EKG  EKG Interpretation  Date/Time:  Thursday Dec 11, 2016 17:31:28 EDT Ventricular Rate:  77 PR Interval:    QRS Duration: 129 QT Interval:  379 QTC Calculation: 366 R Axis:   74 Text Interpretation:  Sinus or ectopic atrial rhythm Multiple ventricular premature complexes LAE, consider biatrial enlargement Right bundle branch block Since last tracing from 7/18, there is now wide QRS and abnormal rhythm, appears ectopic Confirmed by Eber Hong ([id number]) on 12/11/2016 5:38:48 PM      Radiology Ct Abdomen Pelvis Wo Contrast  Result Date: 12/05/2016 CLINICAL DATA:  Left-sided abdominal pain.  Diarrhea. EXAM: CT ABDOMEN AND PELVIS WITHOUT CONTRAST TECHNIQUE: Multidetector CT imaging of the abdomen and pelvis was performed following the standard protocol without IV contrast. COMPARISON:  05/09/2016 FINDINGS: Lower chest: Partial collapse/ infiltrate in the right middle lobe and right lower lobe worrisome for pneumonia. Minimal atelectasis at the left base.  Marked elevation of the right hemidiaphragm. Diaphragmatic hernias on the right. Hepatobiliary: No significant liver parenchymal finding. Previous cholecystectomy. Pancreas: No pancreatic abnormality seen. Spleen: Normal Adrenals/Urinary Tract: Some motion degradation. Adrenal glands are normal. Atrophy of the left kidney relative to the right. No evidence of mass, stone or hydronephrosis. No bladder abnormality seen. Stomach/Bowel: Diverticulosis without imaging evidence of diverticulitis. No evidence of bowel obstruction. Small amount of free fluid in the cul de sac. Vascular/Lymphatic: Aortic atherosclerosis. No aneurysm. IVC is normal. No retroperitoneal adenopathy. Reproductive: Previous hysterectomy.  No pelvic mass. Other: Small amount of free fluid in the cul de sac as noted above. No free air. Small supraumbilical midline hernia containing only fat. Right femoral hernia containing fat and a small amount of fluid. No intestinal herniation or obstruction in that location. Musculoskeletal: March curvature in degenerative change of the spine. IMPRESSION: No cause of acute pain is identified. Diverticulosis without imaging evidence of diverticulitis. No sign of bowel obstruction. Elevated right hemidiaphragm. Right middle lobe and right lower lobe atelectasis and/or pneumonia. Mild renal atrophy on the left. Aortic atherosclerosis. Ventral hernia and right femoral hernia containing only fat. Small amount of free fluid in the cul-de-sac and in the right hernia, etiology unknown. Electronically Signed   By: Paulina Fusi M.D.   On: 12/05/2016 07:07   Ct Head Wo Contrast  Result Date: 2016/12/11 CLINICAL DATA:  Altered level consciousness.  Ventilated patient EXAM: CT HEAD WITHOUT CONTRAST TECHNIQUE: Contiguous axial images were obtained from the base of the skull through the vertex without intravenous contrast. COMPARISON:  None. FINDINGS: Brain: No acute intracranial hemorrhage. No focal mass lesion. No CT  evidence of acute infarction. No midline shift or mass effect. No hydrocephalus. Basilar cisterns are patent. Mild periventricular white matter hypodensities. Mild from cortical atrophy. Small soft tissue mass in the sella turcica measuring 12 mm x 16 mm by 14 mm. (image 35, series 6) Vascular: No hyperdense vessel or unexpected calcification. Skull: Normal. Negative for fracture or focal lesion. Sinuses/Orbits: There is opacification of the LEFT sphenoid hemi sinus. Other: None IMPRESSION: 1. No acute intracranial findings. 2. 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 Recommend MRI with without contrast for further evaluation. 3.  Opacification of the LEFT sphenoidsinus is most consistent with chronic sinus inflammation. No bony expansion. 4. Atrophy and white matter microvascular disease. Electronically Signed   By: Genevive Bi M.D.   On: 11/19/2016 19:14   Dg Chest Portable 1 View  Result Date: 12/05/2016 CLINICAL DATA:  ETT placement readjustment EXAM: PORTABLE CHEST 1 VIEW COMPARISON:  11/29/2016 FINDINGS: Endotracheal tube tip is about 2.5 cm superior to carina. No change in elevated right diaphragm and small right pleural effusion and hazy atelectasis or small infiltrate at the right base. Stable cardiomediastinal silhouette with atherosclerosis. Calcified nodules at the left apex. Negative for a pneumothorax. IMPRESSION: Endotracheal tube tip about 2.5 cm superior to carina. Stable small right pleural effusion, elevated right diaphragm and hazy atelectasis or infiltrate at the right base Electronically Signed   By: Jasmine Pang M.D.   On: 11/30/2016 17:57   Dg Chest Portable 1 View  Result Date: 11/28/2016 CLINICAL DATA:  ETT placement EXAM: PORTABLE CHEST 1 VIEW COMPARISON:  08/26/2016, CT 05/09/2016 FINDINGS: Endotracheal tube tip is just above the carina. Elevated right diaphragm with small right pleural effusion and hazy atelectasis or small infiltrate at the right  base. Stable cardiomediastinal silhouette. Stable convex opacity medial left lung base appears to correspond to fatty hernia density on abdominal CT. Calcified nodules of the left apex. IMPRESSION: 1. Endotracheal tube tip just above the carina 2. Elevated right diaphragm with small right effusion and hazy right basilar atelectasis versus small infiltrate Electronically Signed   By: Jasmine Pang M.D.   On: 11/29/2016 17:56   Procedures Procedures (including critical care time)  Medications Ordered in ED Medications  sodium chloride 0.9 % bolus 1,000 mL (1,000 mLs Intravenous New Bag/Given 11/16/2016 1745)  propofol (DIPRIVAN) 1000 MG/100ML infusion (not administered)  midazolam (VERSED) injection 2 mg (2 mg Intravenous Given 11/14/2016 1735)   Initial Impression / Assessment and Plan / ED Course  I have reviewed the triage vital signs and the nursing notes.  Pertinent labs & imaging results that were available during my care of the patient were reviewed by me and considered in my medical decision making (see chart for details).  Karen Espinoza is a 83 y.o. female with PMH of CHF who presented unresponsive and with altered mental status.   Upon arrival the patient was quickly assessed. Stable airway in place ET tube size 8.0, with bilateral breath sound. IV acces was established by nursing and BP was normotensive, HR in the 70's and SpO2 98%. Labs studies were drawn, EKG was ordered, and CXR ordered to evaluate ET tube placement.  She was given 2 mg of IV versed for agitation due to ET tube  Patient started on propofol gtt for sedation however became hypotensive and propofol was dc, and she as given IVF  Chest XR revealed ET tube was slightly above the carina and was pulled 2 cm. With improvement of placement on repeat.   Called the patient's family to discuss the patient's status and at this time request that the ET stay in until they can arrive.   Throughout her ED stay the  patient's mental status gradually began to improve to the point of the patient asking for the tube to be removed. She was able to follow commands and was breathing over the ventilator without difficulty. She was successfully extubated in the ED and tolerated it well without any hypoxia or desaturation.   Labs studies revealed: CMP with AKI creatine of 2.59 up from patient's baseline of 0.75,  mild hyperkalemia K 5.4, hyperglycemia 265, CBC with leukocytosis of 17.7, no evidence of anemia, normal hgb, normal plt. Initial troponin undetectable, ABG respiratory acidosis pH 7.10, pCO2 61, pO2 134, HCO3 19  CT head: No acute intracranial findings  Other possible causes for the patient symptoms that were considered include infectious causes (encephalopathy, meningitis), tumor/abscess, toxic causes (hypoglycemia, renal failure, hepatic failure, toxic ingestion), other and neurologic disorders including seizure, TIA, and hemorrhagic stroke.   Updated the patient's family on test results   Labs and imaging reviewed by myself and considered in medical decision making if ordered.  Imaging interpreted by radiology.  Patient admitted in stable condition.   Pt was discussed with my attending, Dr. Hyacinth Meeker.  Final Clinical Impressions(s) / ED Diagnoses   Final diagnoses:  Acute encephalopathy  Cardiac arrhythmia, unspecified cardiac arrhythmia type  AKI (acute kidney injury) (HCC)  Leukocytosis, unspecified type   New Prescriptions New Prescriptions   No medications on file     Lamont Snowball, MD 12/05/16 1556    Eber Hong, MD 12/20/16 [phone]

## 2016-12-04 NOTE — ED Notes (Signed)
Pt transported to CT with RN, EMT, and RT.

## 2016-12-04 NOTE — Progress Notes (Signed)
Patient admitted from ED s/p extubation. Screaming, Loose stools and BP elevated.Enteric prec. Initiated. Telemetry applied and CCMD called.

## 2016-12-04 NOTE — ED Notes (Signed)
Pt with an episode of vomiting in CT. Brought back to room, suctioned. Pt beginning to wake up. Vital signs stable at this this time.

## 2016-12-04 NOTE — ED Triage Notes (Signed)
[facility]- pt coming from assisted living after she was unresponsive. Pt has valid DNR but was not in pulmonary or cardiac arrest and EMS intubated with an 8.0 tube. HR was initially 30 with EMS but improved to 70 following intubation.

## 2016-12-04 NOTE — ED Notes (Signed)
Informed resident that pt continues to moan as if in pain, points to stomach as source.  Resident bedside

## 2016-12-04 NOTE — ED Notes (Signed)
Family arrived

## 2016-12-04 NOTE — H&P (Signed)
History and Physical    Celes Dedic MVH:[id number] DOB: 1924/03/03 DOA: 11/28/2016  PCP: Manus Gunning, FNP  Patient coming from:  Assisted living  Chief Complaint:   Became unresponsive  HPI: Karen Espinoza is a 83 y.o. female with medical history significant of mild dementia, chf with recent increase in her lasix, anxiety, OA comes in intubated with EMS.  Per history from ED and records pt was walking with a friend at her assisted living when she screamed out in pain and suddenly became unresponsive.  EMS was called on their arrival she was not responsive, and bradycardic in the 30s.  Pt had a DNR MOSE form with her at the assisted living but she was intubated anyway.  While in the ED she became more responsive and was extubated soon after arrival to the ED.  Pt is now more alert.  Rhythm is RBBB , nsr now with rate in the 80s.  Her only daughter/child is here who says she has been miserable lately as her lasix has been increased due to her uncontrolled chf and it makes her urinate all over herself.  She has been expressing to them that she is just "tired of it all".  They deny any recent illnesses with her except the chf exacerbation, she had gained over 7lbs last week.  No n/v/d.  No fevers that they know of.  They do not wish to aggressive treatment or any consideration for a pacemaker if needed.  She is referred for admission for her sudden unresponsiveness.  There was no report of seizure like activity.  Review of Systems: unobtainable from pt due to ams  Past Medical History:  Diagnosis Date  . Anxiety   . Colon polyps   . Depression   . Diverticulosis   . IBS (irritable bowel syndrome)   . Macular degeneration   . Obesity   . Osteoarthritis     Past Surgical History:  Procedure Laterality Date  . ABDOMINAL HYSTERECTOMY    . BRAIN SURGERY     benign tumor  . colonscopy    . GALLBLADDER SURGERY    . NASAL SINUS SURGERY       reports that she has never  smoked. She has never used smokeless tobacco. She reports that she does not drink alcohol or use drugs.  Allergies  Allergen Reactions  . Levaquin [Levofloxacin In D5w]   . Pneumococcal Vaccines   . Sulfa Antibiotics     Family History  Problem Relation Age of Onset  . Family history unknown: Yes    Prior to Admission medications   Medication Sig Start Date End Date Taking? Authorizing Provider  acetaminophen (TYLENOL) 650 MG CR tablet Take 650 mg by mouth every 6 (six) hours as needed for pain.    [provider]  albuterol (PROVENTIL HFA;VENTOLIN HFA) 108 (90 Base) MCG/ACT inhaler Inhale 2 puffs into the lungs every 4 (four) hours as needed for wheezing or shortness of breath.    [provider]  bisacodyl (DULCOLAX) 10 MG suppository Place 10 mg rectally daily as needed for moderate constipation.    [provider]  Calcium Carb-Cholecalciferol (CALTRATE 600+D3 SOFT) 600-800 MG-UNIT CHEW Chew by mouth. Take 1 chew twice daily    [provider]  cetirizine (ZYRTEC) 10 MG tablet Take 10 mg by mouth daily.    [provider]  donepezil (ARICEPT) 5 MG tablet Take 5 mg by mouth at bedtime.    [provider]  fluticasone (FLONASE) 50 MCG/ACT  nasal spray Place 2 sprays into both nostrils daily.    [provider]  furosemide (LASIX) 20 MG tablet Take 1 tablet (20 mg total) by mouth 2 (two) times daily. 08/28/16 08/28/17  Dorothea Ogle, MD  gabapentin (NEURONTIN) 300 MG capsule Take 300 mg by mouth 3 (three) times daily.    [provider]  guaiFENesin-dextromethorphan (ROBITUSSIN DM) 100-10 MG/5ML syrup Take 10 mLs by mouth every 4 (four) hours as needed for cough.    [provider]  hydrocortisone (CORTEF) 10 MG tablet Take 10 mg by mouth daily.    [provider]  lisinopril (PRINIVIL,ZESTRIL) 40 MG tablet Take 40 mg by mouth daily.    [provider]  LORazepam (ATIVAN) 0.5 MG tablet Take 1  tablet (0.5 mg total) by mouth every 8 (eight) hours as needed for anxiety or sleep. 08/28/16   Dorothea Ogle, MD  magnesium hydroxide (MILK OF MAGNESIA) 400 MG/5ML suspension Take 30 mLs by mouth daily as needed for mild constipation.    [provider]  meloxicam (MOBIC) 15 MG tablet Take 15 mg by mouth daily.    [provider]  metoprolol tartrate (LOPRESSOR) 25 MG tablet Take 1 tablet (25 mg total) by mouth 2 (two) times daily. 08/28/16   Dorothea Ogle, MD  Multiple Vitamin tablet Take 1 tablet by mouth daily. Daily vite    [provider]  ondansetron (ZOFRAN) 4 MG tablet Take 4 mg by mouth every 8 (eight) hours as needed for nausea or vomiting.    [provider]  polyethylene glycol (MIRALAX / GLYCOLAX) packet Take 17 g by mouth daily as needed.    [provider]  sertraline (ZOLOFT) 50 MG tablet Take 50 mg by mouth at bedtime.    [provider]  simethicone (MYLICON) 125 MG chewable tablet Chew 125 mg by mouth every 6 (six) hours as needed for flatulence.    [provider]    Physical Exam: Vitals:   12-05-16 1922 2016-12-05 1924 December 05, 2016 1927 12/05/16 1930  BP:  (!) 90/57 103/71 (!) 98/47  Pulse:  80 80 80  Resp:  17 (!) 25 (!) 32  Temp:      TempSrc:      SpO2: 100% 97% 100% 100%    Constitutional: NAD, calm, un comfortable and anxious Vitals:   05-Dec-2016 1922 2016/12/05 1924 12/05/16 1927 2016-12-05 1930  BP:  (!) 90/57 103/71 (!) 98/47  Pulse:  80 80 80  Resp:  17 (!) 25 (!) 32  Temp:      TempSrc:      SpO2: 100% 97% 100% 100%   Eyes: PERRL, lids and conjunctivae normal ENMT: Mucous membranes are moist. Posterior pharynx clear of any exudate or lesions.Normal dentition.  Neck: normal, supple, no masses, no thyromegaly Respiratory: clear to auscultation bilaterally, no wheezing, no crackles. Normal respiratory effort. No accessory muscle use.  Cardiovascular: Regular rate and rhythm, no murmurs / rubs /  gallops. No extremity edema. 2+ pedal pulses. No carotid bruits.  Abdomen: no tenderness, no masses palpated. No hepatosplenomegaly. Bowel sounds positive.  Musculoskeletal: no clubbing / cyanosis. No joint deformity upper and lower extremities. Good ROM, no contractures. Normal muscle tone.  Skin: no rashes, lesions, ulcers. No induration Neurologic: MAE, CN grossly intact.  Cannot follow commands Psychiatric: still sedated some  Labs on Admission: I have personally reviewed following labs and imaging studies  CBC:  Recent Labs Lab December 05, 2016 1738 12/05/2016 1750  WBC 17.7*  --  NEUTROABS 12.5*  --   HGB 13.2 13.9  HCT 42.8 41.0  MCV 101.4*  --   PLT 229  --    Basic Metabolic Panel:  Recent Labs Lab Mar 19, 2016 1738 Mar 19, 2016 1750  NA 141 139  K 5.4* 5.0  CL 102 104  CO2 18*  --   GLUCOSE 265* 264*  BUN 51* 52*  CREATININE 2.59* 2.30*  CALCIUM 8.6*  --    GFR: CrCl cannot be calculated (Unknown ideal weight.). Liver Function Tests:  Recent Labs Lab Mar 19, 2016 1738  AST 72*  ALT 28  ALKPHOS 127*  BILITOT 0.8  PROT 5.9*  ALBUMIN 3.3*   Coagulation Profile:  Recent Labs Lab Mar 19, 2016 1738  INR 1.92   Cardiac Enzymes:  Recent Labs Lab Mar 19, 2016 1738  TROPONINI <0.03    Radiological Exams on Admission: Ct Head Wo Contrast  Result Date: 03/12/2016 CLINICAL DATA:  Altered level consciousness.  Ventilated patient EXAM: CT HEAD WITHOUT CONTRAST TECHNIQUE: Contiguous axial images were obtained from the base of the skull through the vertex without intravenous contrast. COMPARISON:  None. FINDINGS: Brain: No acute intracranial hemorrhage. No focal mass lesion. No CT evidence of acute infarction. No midline shift or mass effect. No hydrocephalus. Basilar cisterns are patent. Mild periventricular white matter hypodensities. Mild from cortical atrophy. Small soft tissue mass in the sella turcica measuring 12 mm x 16 mm by 14 mm. (image 35, series 6) Vascular: No  hyperdense vessel or unexpected calcification. Skull: Normal. Negative for fracture or focal lesion. Sinuses/Orbits: There is opacification of the LEFT sphenoid hemi sinus. Other: None IMPRESSION: 1. No acute intracranial findings. 2. 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 Recommend MRI with without contrast for further evaluation. 3. Opacification of the LEFT sphenoidsinus is most consistent with chronic sinus inflammation. No bony expansion. 4. Atrophy and white matter microvascular disease. Electronically Signed   By: Genevive BiStewart  Edmunds M.D.   On: 03/12/2016 19:14   Dg Chest Portable 1 View  Result Date: 03/12/2016 CLINICAL DATA:  ETT placement readjustment EXAM: PORTABLE CHEST 1 VIEW COMPARISON:  03/12/2016 FINDINGS: Endotracheal tube tip is about 2.5 cm superior to carina. No change in elevated right diaphragm and small right pleural effusion and hazy atelectasis or small infiltrate at the right base. Stable cardiomediastinal silhouette with atherosclerosis. Calcified nodules at the left apex. Negative for a pneumothorax. IMPRESSION: Endotracheal tube tip about 2.5 cm superior to carina. Stable small right pleural effusion, elevated right diaphragm and hazy atelectasis or infiltrate at the right base Electronically Signed   By: Jasmine PangKim  Fujinaga M.D.   On: 03/12/2016 17:57   Dg Chest Portable 1 View  Result Date: 03/12/2016 CLINICAL DATA:  ETT placement EXAM: PORTABLE CHEST 1 VIEW COMPARISON:  08/26/2016, CT 05/09/2016 FINDINGS: Endotracheal tube tip is just above the carina. Elevated right diaphragm with small right pleural effusion and hazy atelectasis or small infiltrate at the right base. Stable cardiomediastinal silhouette. Stable convex opacity medial left lung base appears to correspond to fatty hernia density on abdominal CT. Calcified nodules of the left apex. IMPRESSION: 1. Endotracheal tube tip just above the carina 2. Elevated right diaphragm with small right effusion  and hazy right basilar atelectasis versus small infiltrate Electronically Signed   By: Jasmine PangKim  Fujinaga M.D.   On: 03/12/2016 17:56    EKG: Independently reviewed.  rbbb new c/w old 7/18 Old chart reviewed Case discussed with dr Hyacinth Meekermiller in ED  Assessment/Plan 83yo female with sudden unresponsiveness of unclear etiology intubated  in the field now extubated   Principal Problem:   Acute encephalopathy- unclear if she got significantly bradycardic, vs orthostatic with vagal response due to recent increase in diuretics vs other cardiac event vs stroke vs infection.  Mental status improving.  Ck lactic acid level and procalcitonin.  Empirically cover with abx for possible pna with vanc/zosyn.  Ivf.  Serial troponin.  freq neuro cks.  Cannot tolerate mri at this time (family really not interested in this at this time).  Monitor in stepdown.  Family agreeable to ivf and abx if needed, no pressors, no cpr, no intubation.  Active Problems:   Acute hypoxemic respiratory failure (HCC)- improved with [place]oll at this time   Bradycardia- nsr at this time , cont to monitor, holding bblocker   CHF, chronic (HCC)- holding all diuretics   Anxiety- resume her ativan    AKI (acute kidney injury) (HCC)- aki in the setting of recent increase in diuretics raises the concern if she has been overdiuresed, got vagal or orthostatic while walking??  Ivf.  Hold ace/nsaids/bp meds   Dementia- noted     DVT prophylaxis:  Scds Code Status:  DNR Family Communication:  daughter  Disposition Plan:  Per day team Consults called:  none Admission status:   Admit  If no improvement in the next 24 -48 hours would consider discussing with daughter comfort measures only.   DAVID,RACHAL A MD [facility]  If 7PM-7AM, please contact night-coverage [email or web address] Password [email or web address]  2016/12/12, 8:52 PM

## 2016-12-04 NOTE — ED Notes (Signed)
MD and RT at bedside. Preparing to extubate. Pt following all commands at this time.

## 2016-12-04 NOTE — ED Notes (Signed)
Attempted to call report

## 2016-12-04 NOTE — Procedures (Signed)
Extubation Procedure Note  Patient Details:   Name: Karen Espinoza DOB: 1924/03/15 MRN: [id number]   Airway Documentation:     Evaluation  O2 sats: stable throughout Complications: No apparent complications Patient did tolerate procedure well. Bilateral Breath Sounds: Diminished, Rhonchi   Yes   Pt. Extubated to a non-rebreather per MD. Pt. Was hyper-oxygenated, suctioned and had a positive cuff leak. No issues at this time, pt.sats 98% on NRB.  Adela PortsBrown, Joanann Mies Henry 12/09/2016, 7:24 PM

## 2016-12-04 NOTE — ED Notes (Signed)
Informed provider of bp, fluids are running

## 2016-12-05 ENCOUNTER — Inpatient Hospital Stay (HOSPITAL_COMMUNITY): Payer: Medicare Other

## 2016-12-05 ENCOUNTER — Encounter (HOSPITAL_COMMUNITY): Payer: Self-pay | Admitting: *Deleted

## 2016-12-05 ENCOUNTER — Other Ambulatory Visit (HOSPITAL_COMMUNITY): Payer: Medicare Other

## 2016-12-05 DIAGNOSIS — G9340 Encephalopathy, unspecified: Secondary | ICD-10-CM

## 2016-12-05 LAB — CBC
HEMATOCRIT: 41 % (ref 36.0–46.0)
Hemoglobin: 13.3 g/dL (ref 12.0–15.0)
MCH: 31.8 pg (ref 26.0–34.0)
MCHC: 32.4 g/dL (ref 30.0–36.0)
MCV: 98.1 fL (ref 78.0–100.0)
PLATELETS: 193 10*3/uL (ref 150–400)
RBC: 4.18 MIL/uL (ref 3.87–5.11)
RDW: 16.2 % — AB (ref 11.5–15.5)
WBC: 21.7 10*3/uL — ABNORMAL HIGH (ref 4.0–10.5)

## 2016-12-05 LAB — C DIFFICILE QUICK SCREEN W PCR REFLEX
C Diff antigen: NEGATIVE
C Diff interpretation: NOT DETECTED
C Diff toxin: NEGATIVE

## 2016-12-05 LAB — TROPONIN I
Troponin I: 0.57 ng/mL (ref <0.03)
Troponin I: 0.53 ng/mL (ref <0.03)

## 2016-12-05 LAB — MRSA PCR SCREENING: MRSA by PCR: NEGATIVE

## 2016-12-05 LAB — LACTIC ACID, PLASMA
Lactic Acid, Venous: 1.5 mmol/L (ref 0.5–1.9)
Lactic Acid, Venous: 2.2 mmol/L (ref 0.5–1.9)

## 2016-12-05 LAB — BASIC METABOLIC PANEL
Anion gap: 13 (ref 5–15)
BUN: 59 mg/dL — AB (ref 6–20)
CHLORIDE: 109 mmol/L (ref 101–111)
CO2: 20 mmol/L — AB (ref 22–32)
Calcium: 7.3 mg/dL — ABNORMAL LOW (ref 8.9–10.3)
Creatinine, Ser: 2.32 mg/dL — ABNORMAL HIGH (ref 0.44–1.00)
GFR calc Af Amer: 20 mL/min — ABNORMAL LOW (ref >60)
GFR calc non Af Amer: 17 mL/min — ABNORMAL LOW (ref >60)
GLUCOSE: 105 mg/dL — AB (ref 65–99)
POTASSIUM: 5.8 mmol/L — AB (ref 3.5–5.1)
Sodium: 142 mmol/L (ref 135–145)

## 2016-12-05 MED ORDER — HALOPERIDOL 0.5 MG PO TABS
0.5000 mg | ORAL_TABLET | ORAL | Status: DC | PRN
  Filled 2016-12-05: qty 1

## 2016-12-05 MED ORDER — ACETAMINOPHEN 325 MG PO TABS
650.0000 mg | ORAL_TABLET | Freq: Four times a day (QID) | ORAL | Status: DC | PRN
Start: 2016-12-05 — End: 2016-12-07

## 2016-12-05 MED ORDER — BISACODYL 10 MG RE SUPP: 10.0000 mg | Freq: Every day | RECTAL | Status: DC | PRN

## 2016-12-05 MED ORDER — ALBUMIN HUMAN 25 % IV SOLN
25.0000 g | Freq: Once | INTRAVENOUS | Status: DC
  Filled 2016-12-05: qty 50

## 2016-12-05 MED ORDER — SODIUM CHLORIDE 0.9 % IV SOLN
3.0000 mg/h | INTRAVENOUS | Status: DC
  Administered 2016-12-05: 1 mg/h via INTRAVENOUS
  Filled 2016-12-05: qty 10

## 2016-12-05 MED ORDER — SODIUM CHLORIDE 0.9 % IV SOLN: 250.0000 mL | INTRAVENOUS | Status: DC | PRN

## 2016-12-05 MED ORDER — DIPHENHYDRAMINE HCL 50 MG/ML IJ SOLN: 12.5000 mg | INTRAMUSCULAR | Status: DC | PRN

## 2016-12-05 MED ORDER — ACETAMINOPHEN 650 MG RE SUPP: 650.0000 mg | Freq: Four times a day (QID) | RECTAL | Status: DC | PRN

## 2016-12-05 MED ORDER — SODIUM CHLORIDE 0.9 % IV BOLUS (SEPSIS)
500.0000 mL | Freq: Once | INTRAVENOUS | Status: AC
  Administered 2016-12-05: 500 mL via INTRAVENOUS

## 2016-12-05 MED ORDER — SODIUM CHLORIDE 0.9% FLUSH
3.0000 mL | Freq: Two times a day (BID) | INTRAVENOUS | Status: DC
  Administered 2016-12-05 – 2016-12-06 (×2): 3 mL via INTRAVENOUS

## 2016-12-05 MED ORDER — LORAZEPAM 2 MG/ML PO CONC: 1.0000 mg | ORAL | Status: DC | PRN

## 2016-12-05 MED ORDER — LORAZEPAM 1 MG PO TABS: 1.0000 mg | ORAL_TABLET | ORAL | Status: DC | PRN

## 2016-12-05 MED ORDER — HALOPERIDOL LACTATE 5 MG/ML IJ SOLN: 0.5000 mg | INTRAMUSCULAR | Status: DC | PRN

## 2016-12-05 MED ORDER — HALOPERIDOL LACTATE 2 MG/ML PO CONC
0.5000 mg | ORAL | Status: DC | PRN
  Filled 2016-12-05: qty 0.3

## 2016-12-05 MED ORDER — ONDANSETRON HCL 4 MG/2ML IJ SOLN: 4.0000 mg | Freq: Four times a day (QID) | INTRAMUSCULAR | Status: DC | PRN

## 2016-12-05 MED ORDER — GLYCOPYRROLATE 0.2 MG/ML IJ SOLN
0.2000 mg | INTRAMUSCULAR | Status: DC | PRN
  Filled 2016-12-05: qty 1

## 2016-12-05 MED ORDER — SODIUM CHLORIDE 0.9% FLUSH: 3.0000 mL | INTRAVENOUS | Status: DC | PRN

## 2016-12-05 MED ORDER — GLYCOPYRROLATE 1 MG PO TABS
1.0000 mg | ORAL_TABLET | ORAL | Status: DC | PRN
  Filled 2016-12-05: qty 1

## 2016-12-05 MED ORDER — LORAZEPAM 2 MG/ML IJ SOLN
1.0000 mg | INTRAMUSCULAR | Status: DC | PRN
  Administered 2016-12-05: 1 mg via INTRAVENOUS
  Filled 2016-12-05: qty 1

## 2016-12-05 MED ORDER — SODIUM CHLORIDE 0.9 % IV BOLUS (SEPSIS): 500.0000 mL | Freq: Once | INTRAVENOUS | Status: DC

## 2016-12-05 MED ORDER — DOPAMINE-DEXTROSE 3.2-5 MG/ML-% IV SOLN
0.0000 ug/kg/min | INTRAVENOUS | Status: DC
  Administered 2016-12-05: 10 ug/kg/min via INTRAVENOUS
  Administered 2016-12-05: 2.5 ug/kg/min via INTRAVENOUS
  Filled 2016-12-05: qty 250

## 2016-12-05 MED ORDER — ONDANSETRON 4 MG PO TBDP
4.0000 mg | ORAL_TABLET | Freq: Four times a day (QID) | ORAL | Status: DC | PRN
  Filled 2016-12-05: qty 1

## 2016-12-05 MED ORDER — GLYCOPYRROLATE 0.2 MG/ML IJ SOLN
0.2000 mg | INTRAMUSCULAR | Status: DC | PRN
  Administered 2016-12-05: 0.2 mg via INTRAVENOUS
  Filled 2016-12-05 (×2): qty 1

## 2016-12-05 NOTE — Progress Notes (Signed)
MD called RN to check on pt's BP. BP low in MAP in the 50's. IV fluid bolus started stat, trendelenburg position, daughter called and maintain DNR status and ok with using dopamine infusion. Request witnessed by pt's RN and 2 other nurses over the phone. Close monitoring.

## 2016-12-05 NOTE — Progress Notes (Signed)
On call provider notified about patient low BP. Ordered of Bolus.

## 2016-12-05 NOTE — Progress Notes (Signed)
Attempted to visit with patient to provide emotional and spiritual support.  No family present in room.  Please page Chaplain should support be needed, we are happy to provide support to family and patient as needed.    12/05/16 1508  Clinical Encounter Type  Visited With Patient  Visit Type Initial;Critical Care

## 2016-12-05 NOTE — Progress Notes (Signed)
Results are back. C.Diff is negative and MRSA is negative.

## 2016-12-05 NOTE — Progress Notes (Signed)
Call provider on call about patient elevated Troponin and frequent diarrhea. No new orders at [facility] time.

## 2016-12-05 NOTE — Progress Notes (Signed)
[facility] Progress Note  Patient: Karen Espinoza ZOX:[id number]   PCP: Manus GunningMazurek, Maggie, FNP DOB: [birth year]   DOA: 11/13/2016   DOS: 12/05/2016   Date of Service: the patient was seen and examined on 12/05/2016  Subjective: Patient crying in pain.  Requesting something to help her sleep.  Keeps on repeating that I want to die and I want to die of my place.  She wants to remain comfortable husband that she can.  Brief hospital course: Pt. with PMH of mild dementia, chronic CHF, arthritis; admitted on 12/06/2016, presented with complaint of syncopal event, was found to have acute respiratory failure with sepsis. Currently further plan is comfort care.  Assessment and Plan: 1.  Acute hypercarbic and hypoxic respiratory failure. Septic shock due to healthcare associated pneumonia. Acute kidney injury. Demand ischemia. Acute encephalopathy with unresponsive episode. Patient presented with an episode of unresponsiveness.  She was walking with a friend, suddenly screamed out of pain in become unresponsive. Patient was intubated by EMS. Has DNR paperwork's and therefore was extubated on arrival to ER when she was more awake. Patient was referred for admission for sepsis. Overnight required multiple IV fluid boluses and was actually started on dopamine infusion due to septic shock. At the time of my evaluation remains hypoxic, in distress with minimal urine output. Discussed with family at bedside goals of care.  Patient requested to remain comfortable.  Family agreed with patient's wishes and patient's care will transition to complete comfort. Initially started on morphine infusion at 1 mg/h, later on changed to 2 mg/h due to continuous discomfort. Continue as needed Ativan. We will continue to monitor in the stepdown unit tonight. Anticipating in hospital death for this patient.  Diet: comfort feeds  Advance goals of care discussion: comfort feeds  Family Communication:  family was present at bedside, at the time of interview. The pt provided permission to discuss medical plan with the family. Opportunity was given to ask question and all questions were answered satisfactorily.   Disposition:  Discharge to be determined.   Consultants: none Procedures: none  Antibiotics: Anti-infectives    Start     Dose/Rate Route Frequency Ordered Stop   12/03/2016 2345  vancomycin (VANCOCIN) IVPB 1000 mg/200 mL premix  Status:  Discontinued     1,000 mg 200 mL/hr over 60 Minutes Intravenous Every 48 hours 12/01/2016 2334 12/05/16 1004   11/16/2016 2345  piperacillin-tazobactam (ZOSYN) IVPB 3.375 g  Status:  Discontinued     3.375 g 12.5 mL/hr over 240 Minutes Intravenous Every 8 hours 12/06/2016 2334 12/05/16 1004       Objective: Physical Exam: Vitals:   12/05/16 0900 12/05/16 1000 12/05/16 1100 12/05/16 1345  BP: 129/64 123/66 (!) 90/41 (!) 81/39  Pulse: (!) 102 84 78 81  Resp: (!) 23 (!) 29 (!) 23   Temp:      TempSrc:      SpO2: 96% 99% 92% 99%  Weight:      Height:        Intake/Output Summary (Last 24 hours) at 12/05/16 1729 Last data filed at 12/05/16 0817  Gross per 24 hour  Intake             1000 ml  Output              100 ml  Net              900 ml   Filed Weights   12/09/2016 2300 12/05/16 0321  Weight: 85.8 kg (189  lb 3.2 oz) 80.9 kg (178 lb 6.4 oz)   General: Alert, Awake and Oriented to Person. Appear in severe distress, affect irritable Eyes: PERRL, Conjunctiva normal ENT: Oral Mucosa clear moist. Neck: difficult to assess JVD, no Abnormal Mass Or lumps Cardiovascular: S1 and S2 Present, no Murmur, Peripheral Pulses Present Respiratory: increased respiratory effort, Bilateral Air entry equal and Decreased, positive use of accessory muscle, bilateral Crackles, no wheezes Abdomen: Bowel Sound present, Soft and difficult to assess tenderness, no hernia Skin: no redness, no Rash, no induration Extremities: no Pedal edema, no calf  tenderness Neurologic: Grossly no focal neuro deficit. Bilaterally Equal motor strength  Data Reviewed: CBC:  Recent Labs Lab 11/28/2016 1738 12/03/2016 1750 12/05/16 0630  WBC 17.7*  --  21.7*  NEUTROABS 12.5*  --   --   HGB 13.2 13.9 13.3  HCT 42.8 41.0 41.0  MCV 101.4*  --  98.1  PLT 229  --  193   Basic Metabolic Panel:  Recent Labs Lab 11/13/2016 1738 11/10/2016 1750 11/18/2016 2128 12/05/16 0630  NA 141 139  --  142  K 5.4* 5.0  --  5.8*  CL 102 104  --  109  CO2 18*  --   --  20*  GLUCOSE 265* 264*  --  105*  BUN 51* 52*  --  59*  CREATININE 2.59* 2.30*  --  2.32*  CALCIUM 8.6*  --   --  7.3*  MG  --   --  2.3  --     Liver Function Tests:  Recent Labs Lab 11/24/2016 1738  AST 72*  ALT 28  ALKPHOS 127*  BILITOT 0.8  PROT 5.9*  ALBUMIN 3.3*   No results for input(s): LIPASE, AMYLASE in the last 168 hours. No results for input(s): AMMONIA in the last 168 hours. Coagulation Profile:  Recent Labs Lab 11/15/2016 1738  INR 1.92   Cardiac Enzymes:  Recent Labs Lab 12/06/2016 0120 11/30/2016 1738 12/05/16 0630  TROPONINI 0.57* <0.03 0.53*   BNP (last 3 results) No results for input(s): PROBNP in the last 8760 hours. CBG: No results for input(s): GLUCAP in the last 168 hours. Studies: Ct Abdomen Pelvis Wo Contrast  Result Date: 12/05/2016 CLINICAL DATA:  Left-sided abdominal pain.  Diarrhea. EXAM: CT ABDOMEN AND PELVIS WITHOUT CONTRAST TECHNIQUE: Multidetector CT imaging of the abdomen and pelvis was performed following the standard protocol without IV contrast. COMPARISON:  05/09/2016 FINDINGS: Lower chest: Partial collapse/ infiltrate in the right middle lobe and right lower lobe worrisome for pneumonia. Minimal atelectasis at the left base. Marked elevation of the right hemidiaphragm. Diaphragmatic hernias on the right. Hepatobiliary: No significant liver parenchymal finding. Previous cholecystectomy. Pancreas: No pancreatic abnormality seen. Spleen: Normal  Adrenals/Urinary Tract: Some motion degradation. Adrenal glands are normal. Atrophy of the left kidney relative to the right. No evidence of mass, stone or hydronephrosis. No bladder abnormality seen. Stomach/Bowel: Diverticulosis without imaging evidence of diverticulitis. No evidence of bowel obstruction. Small amount of free fluid in the cul de sac. Vascular/Lymphatic: Aortic atherosclerosis. No aneurysm. IVC is normal. No retroperitoneal adenopathy. Reproductive: Previous hysterectomy.  No pelvic mass. Other: Small amount of free fluid in the cul de sac as noted above. No free air. Small supraumbilical midline hernia containing only fat. Right femoral hernia containing fat and a small amount of fluid. No intestinal herniation or obstruction in that location. Musculoskeletal: March curvature in degenerative change of the spine. IMPRESSION: No cause of acute pain is identified. Diverticulosis without imaging evidence  of diverticulitis. No sign of bowel obstruction. Elevated right hemidiaphragm. Right middle lobe and right lower lobe atelectasis and/or pneumonia. Mild renal atrophy on the left. Aortic atherosclerosis. Ventral hernia and right femoral hernia containing only fat. Small amount of free fluid in the cul-de-sac and in the right hernia, etiology unknown. Electronically Signed   By: Paulina Fusi M.D.   On: 12/05/2016 07:07   Ct Head Wo Contrast  Result Date: 11/10/2016 CLINICAL DATA:  Altered level consciousness.  Ventilated patient EXAM: CT HEAD WITHOUT CONTRAST TECHNIQUE: Contiguous axial images were obtained from the base of the skull through the vertex without intravenous contrast. COMPARISON:  None. FINDINGS: Brain: No acute intracranial hemorrhage. No focal mass lesion. No CT evidence of acute infarction. No midline shift or mass effect. No hydrocephalus. Basilar cisterns are patent. Mild periventricular white matter hypodensities. Mild from cortical atrophy. Small soft tissue mass in the sella  turcica measuring 12 mm x 16 mm by 14 mm. (image 35, series 6) Vascular: No hyperdense vessel or unexpected calcification. Skull: Normal. Negative for fracture or focal lesion. Sinuses/Orbits: There is opacification of the LEFT sphenoid hemi sinus. Other: None IMPRESSION: 1. No acute intracranial findings. 2. 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 Recommend MRI with without contrast for further evaluation. 3. Opacification of the LEFT sphenoidsinus is most consistent with chronic sinus inflammation. No bony expansion. 4. Atrophy and white matter microvascular disease. Electronically Signed   By: Genevive Bi M.D.   On: 11/22/2016 19:14   Dg Chest Portable 1 View  Result Date: 11/25/2016 CLINICAL DATA:  ETT placement readjustment EXAM: PORTABLE CHEST 1 VIEW COMPARISON:  11/17/2016 FINDINGS: Endotracheal tube tip is about 2.5 cm superior to carina. No change in elevated right diaphragm and small right pleural effusion and hazy atelectasis or small infiltrate at the right base. Stable cardiomediastinal silhouette with atherosclerosis. Calcified nodules at the left apex. Negative for a pneumothorax. IMPRESSION: Endotracheal tube tip about 2.5 cm superior to carina. Stable small right pleural effusion, elevated right diaphragm and hazy atelectasis or infiltrate at the right base Electronically Signed   By: Jasmine Pang M.D.   On: 12/10/2016 17:57   Dg Chest Portable 1 View  Result Date: 12/05/2016 CLINICAL DATA:  ETT placement EXAM: PORTABLE CHEST 1 VIEW COMPARISON:  08/26/2016, CT 05/09/2016 FINDINGS: Endotracheal tube tip is just above the carina. Elevated right diaphragm with small right pleural effusion and hazy atelectasis or small infiltrate at the right base. Stable cardiomediastinal silhouette. Stable convex opacity medial left lung base appears to correspond to fatty hernia density on abdominal CT. Calcified nodules of the left apex. IMPRESSION: 1. Endotracheal tube tip  just above the carina 2. Elevated right diaphragm with small right effusion and hazy right basilar atelectasis versus small infiltrate Electronically Signed   By: Jasmine Pang M.D.   On: 12/01/2016 17:56    Scheduled Meds: . sodium chloride flush  3 mL Intravenous Q12H   Continuous Infusions: . sodium chloride    . morphine 2 mg/hr (12/05/16 1226)   PRN Meds: sodium chloride, acetaminophen **OR** acetaminophen, albuterol, bisacodyl, diphenhydrAMINE, glycopyrrolate **OR** glycopyrrolate **OR** glycopyrrolate, haloperidol **OR** haloperidol **OR** haloperidol lactate, LORazepam **OR** LORazepam **OR** LORazepam, ondansetron **OR** ondansetron (ZOFRAN) IV, sodium chloride flush  Time spent: The patient is critically ill with multiple organ systems failure and requires high complexity decision making for assessment and support, frequent evaluation and titration of therapies. Critical Care Time devoted to patient care services described in this note is 40  minutes   Author: Lynden Oxford, MD [facility] Pager: [phone] 12/05/2016 5:29 PM  If 7PM-7AM, please contact night-coverage at [email or web address], password [facility]

## 2016-12-05 NOTE — Plan of Care (Signed)
Problem: Safety: Goal: Ability to remain free from injury will improve Outcome: Progressing Bed in low position. Call bell within reach.   

## 2016-12-06 LAB — GLUCOSE, CAPILLARY: Glucose-Capillary: 93 mg/dL (ref 65–99)

## 2016-12-06 MED ORDER — LORAZEPAM 2 MG/ML IJ SOLN
0.5000 mg | Freq: Two times a day (BID) | INTRAMUSCULAR | Status: DC
  Administered 2016-12-06: 0.5 mg via INTRAVENOUS
  Filled 2016-12-06: qty 1

## 2016-12-06 MED ORDER — GLYCOPYRROLATE 0.2 MG/ML IJ SOLN
0.1000 mg | Freq: Once | INTRAMUSCULAR | Status: AC
  Administered 2016-12-06: 0.1 mg via INTRAVENOUS
  Filled 2016-12-06: qty 0.5

## 2016-12-06 NOTE — Plan of Care (Signed)
Problem: Safety: Goal: Ability to remain free from injury will improve Outcome: Progressing Bed in low position. Call bell within reach.   

## 2016-12-06 NOTE — Progress Notes (Signed)
Patient unresponsive, still breathing and bp is elevated at this time. Will continue to monitor.

## 2016-12-06 NOTE — Progress Notes (Signed)
Patient is unresponsive, patient is still breathing and O2 94% on 8L Venti Mask. Will continue to monitor throughout shift.

## 2016-12-06 NOTE — Progress Notes (Addendum)
[facility] Progress Note  Patient: Karen Espinoza ZOX:[id number]   PCP: Manus Gunning, FNP DOB: Sep 13, 1924   DOA: 12-Dec-2016   DOS: 12/06/2016   Date of Service: the patient was seen and examined on 12/06/2016  Subjective: Patient crying in pain.  Requesting something to help her sleep.  Keeps on repeating that I want to die and I want to die of my place.  She wants to remain comfortable as much as she can.  Brief hospital course: Pt. with PMH of mild dementia, chronic CHF, arthritis; admitted on 12-12-16, presented with complaint of syncopal event, was found to have acute respiratory failure with sepsis. Currently further plan is comfort care.  Assessment and Plan: 1.  Acute hypercarbic and hypoxic respiratory failure. Septic shock due to healthcare associated pneumonia. Acute kidney injury. Demand ischemia. Acute encephalopathy with unresponsive episode. Patient presented with an episode of unresponsiveness.  She was walking with a friend, suddenly screamed out of pain in become unresponsive. Patient was intubated by EMS. Has DNR paperwork's and therefore was extubated on arrival to ER when she was more awake. Patient was referred for admission for sepsis. Overnight required multiple IV fluid boluses and was actually started on dopamine infusion due to septic shock. At the time of my evaluation remains hypoxic, in distress with minimal urine output. Discussed with family at bedside goals of care.  Patient requested to remain comfortable.  Family agreed with patient's wishes and patient's care will transition to complete comfort. Initially started on morphine infusion at 1 mg/h, later on changed to 3 mg/h due to continuous discomfort. Continue as needed Ativan.  Adding scheduled Ativan since the patient still appears anxious and has not received any medication in last 24 hours. Transitioning patient to MedSurg. Discussed with palliative care, continue current plan for  now. Anticipating in hospital death for this patient.  Diet: comfort feeds  Advance goals of care discussion: comfort feeds  Family Communication: family was present at bedside, at the time of interview. The pt provided permission to discuss medical plan with the family. Opportunity was given to ask question and all questions were answered satisfactorily.   Disposition:  Discharge to be determined.   Consultants: none Procedures: none  Antibiotics: Anti-infectives    Start     Dose/Rate Route Frequency Ordered Stop   12-Dec-2016 2345  vancomycin (VANCOCIN) IVPB 1000 mg/200 mL premix  Status:  Discontinued     1,000 mg 200 mL/hr over 60 Minutes Intravenous Every 48 hours 12-Dec-2016 2334 12/05/16 1004   12/12/16 2345  piperacillin-tazobactam (ZOSYN) IVPB 3.375 g  Status:  Discontinued     3.375 g 12.5 mL/hr over 240 Minutes Intravenous Every 8 hours 12/12/16 2334 12/05/16 1004       Objective: Physical Exam: Vitals:   12/05/16 1345 12/05/16 2349 12/06/16 0100 12/06/16 0820  BP: (!) 81/39 (!) 84/39 (!) 178/144 (!) 83/41  Pulse: 81 86  97  Resp:      Temp:      TempSrc:      SpO2: 99% 94%  93%  Weight:      Height:        Intake/Output Summary (Last 24 hours) at 12/06/16 1417 Last data filed at 12/06/16 0300  Gross per 24 hour  Intake            30.35 ml  Output              100 ml  Net           -  69.65 ml   Filed Weights   11/18/2016 2300 12/05/16 0321  Weight: 85.8 kg (189 lb 3.2 oz) 80.9 kg (178 lb 6.4 oz)   General: Alert, Awake and Oriented to Person. Appear in severe distress, affect irritable Eyes: PERRL, Conjunctiva normal ENT: Oral Mucosa clear moist. Neck: difficult to assess JVD, no Abnormal Mass Or lumps Cardiovascular: S1 and S2 Present, no Murmur, Peripheral Pulses Present Respiratory: increased respiratory effort, Bilateral Air entry equal and Decreased, positive use of accessory muscle, bilateral Crackles, no wheezes Abdomen: Bowel Sound present, Soft  and difficult to assess tenderness, no hernia Skin: no redness, no Rash, no induration Extremities: no Pedal edema, no calf tenderness Neurologic: Grossly no focal neuro deficit. Bilaterally Equal motor strength  Data Reviewed: CBC:  Recent Labs Lab 12/02/2016 1738 11/12/2016 1750 12/05/16 0630  WBC 17.7*  --  21.7*  NEUTROABS 12.5*  --   --   HGB 13.2 13.9 13.3  HCT 42.8 41.0 41.0  MCV 101.4*  --  98.1  PLT 229  --  193   Basic Metabolic Panel:  Recent Labs Lab 11/12/2016 1738 11/26/2016 1750 11/21/2016 2128 12/05/16 0630  NA 141 139  --  142  K 5.4* 5.0  --  5.8*  CL 102 104  --  109  CO2 18*  --   --  20*  GLUCOSE 265* 264*  --  105*  BUN 51* 52*  --  59*  CREATININE 2.59* 2.30*  --  2.32*  CALCIUM 8.6*  --   --  7.3*  MG  --   --  2.3  --     Liver Function Tests:  Recent Labs Lab 12/03/2016 1738  AST 72*  ALT 28  ALKPHOS 127*  BILITOT 0.8  PROT 5.9*  ALBUMIN 3.3*   No results for input(s): LIPASE, AMYLASE in the last 168 hours. No results for input(s): AMMONIA in the last 168 hours. Coagulation Profile:  Recent Labs Lab 11/10/2016 1738  INR 1.92   Cardiac Enzymes:  Recent Labs Lab 11/19/2016 0120 11/27/2016 1738 12/05/16 0630  TROPONINI 0.57* <0.03 0.53*   BNP (last 3 results) No results for input(s): PROBNP in the last 8760 hours. CBG:  Recent Labs Lab 12/06/16 0605  GLUCAP 93   Studies: No results found.  Scheduled Meds: . LORazepam  0.5 mg Intravenous BID  . sodium chloride flush  3 mL Intravenous Q12H   Continuous Infusions: . sodium chloride    . morphine 3 mg/hr (12/06/16 1302)   PRN Meds: sodium chloride, acetaminophen **OR** acetaminophen, albuterol, bisacodyl, diphenhydrAMINE, glycopyrrolate **OR** glycopyrrolate **OR** glycopyrrolate, haloperidol **OR** haloperidol **OR** haloperidol lactate, LORazepam **OR** LORazepam **OR** LORazepam, ondansetron **OR** ondansetron (ZOFRAN) IV, sodium chloride flush  Time spent: 35  minutes  Author: Lynden OxfordPranav Charisma Charlot, MD [facility] Pager: [phone] 12/06/2016 2:17 PM  If 7PM-7AM, please contact night-coverage at [email or web address], password [email or web address]

## 2016-12-11 NOTE — Discharge Summary (Signed)
[facility] Death Summary   Patient: Karen Espinoza ZOX:[id number]   PCP: Manus Gunning, FNP DOB: April 05, 1924   Date of admission: 2016/12/08       Hospital Diagnoses:  Principal Problem:   Acute encephalopathy Active Problems:   Acute hypoxemic respiratory failure (HCC)   Bradycardia   CHF, chronic (HCC)   Anxiety   AKI (acute kidney injury) (HCC)   Dementia  Date of Death: Date of Death: 11-Dec-2016  Time of Death: Time of Death: 0131   History of present illness: As per the H and P dictated on admission, "Karen Espinoza is a 83 y.o. female with medical history significant of mild dementia, chf with recent increase in her lasix, anxiety, OA comes in intubated with EMS.  Per history from ED and records pt was walking with a friend at her assisted living when she screamed out in pain and suddenly became unresponsive.  EMS was called on their arrival she was not responsive, and bradycardic in the 30s.  Pt had a DNR MOSE form with her at the assisted living but she was intubated anyway.  While in the ED she became more responsive and was extubated soon after arrival to the ED.  Pt is now more alert.  Rhythm is RBBB , nsr now with rate in the 80s.  Her only daughter/child is here who says she has been miserable lately as her lasix has been increased due to her uncontrolled chf and it makes her urinate all over herself.  She has been expressing to them that she is just "tired of it all".  They deny any recent illnesses with her except the chf exacerbation, she had gained over 7lbs last week.  No n/v/d.  No fevers that they know of.  They do not wish to aggressive treatment or any consideration for a pacemaker if needed.  She is referred for admission for her sudden unresponsiveness.  There was no report of seizure like activity."  Hospital Course:  1.  Acute hypercarbic and hypoxic respiratory failure. Septic shock due to aspiration pneumonia. Acute kidney  injury. Hyperkalemia Lactic acidosis Demand ischemia. History of dementia Osteoarthritis Depression Irritable bowel syndrome Chronic diastolic dysfunction on Lasix. Acute on chronic diastolic CHF Acute encephalopathy with unresponsive episode.  Patient presented with an episode of unresponsiveness.  She was walking with a friend, suddenly screamed out of pain in become unresponsive. Patient was intubated by EMS. Has DNR paperwork's and therefore was extubated on arrival to ER when she was more awake. Patient was referred for admission for sepsis. Overnight required multiple IV fluid boluses and was actually started on dopamine infusion due to septic shock. At the time of my evaluation remains hypoxic, in severe distress with minimal urine output. Discussed with family at bedside goals of care.  Patient requested to remain comfortable.  Family agreed with patient's wishes and patient's care was transition to complete comfort. Initially started on morphine infusion at 1 mg/h, later on changed to 3 mg/h due to continuous discomfort. Added scheduled Ativan since the patient still appears anxious and has not received any medication in last 24 hours. The patient was pronounced deceased at 1:31 AM, on 12-11-2016.   Procedures and Results:  none   Consultations:  none  The results of significant diagnostics from this hospitalization (including imaging, microbiology, ancillary and laboratory) are listed below for reference.    Significant Diagnostic Studies: Ct Abdomen Pelvis Wo Contrast  Result Date: 12/05/2016 CLINICAL DATA:  Left-sided abdominal pain.  Diarrhea. EXAM:  CT ABDOMEN AND PELVIS WITHOUT CONTRAST TECHNIQUE: Multidetector CT imaging of the abdomen and pelvis was performed following the standard protocol without IV contrast. COMPARISON:  05/09/2016 FINDINGS: Lower chest: Partial collapse/ infiltrate in the right middle lobe and right lower lobe worrisome for pneumonia. Minimal  atelectasis at the left base. Marked elevation of the right hemidiaphragm. Diaphragmatic hernias on the right. Hepatobiliary: No significant liver parenchymal finding. Previous cholecystectomy. Pancreas: No pancreatic abnormality seen. Spleen: Normal Adrenals/Urinary Tract: Some motion degradation. Adrenal glands are normal. Atrophy of the left kidney relative to the right. No evidence of mass, stone or hydronephrosis. No bladder abnormality seen. Stomach/Bowel: Diverticulosis without imaging evidence of diverticulitis. No evidence of bowel obstruction. Small amount of free fluid in the cul de sac. Vascular/Lymphatic: Aortic atherosclerosis. No aneurysm. IVC is normal. No retroperitoneal adenopathy. Reproductive: Previous hysterectomy.  No pelvic mass. Other: Small amount of free fluid in the cul de sac as noted above. No free air. Small supraumbilical midline hernia containing only fat. Right femoral hernia containing fat and a small amount of fluid. No intestinal herniation or obstruction in that location. Musculoskeletal: March curvature in degenerative change of the spine. IMPRESSION: No cause of acute pain is identified. Diverticulosis without imaging evidence of diverticulitis. No sign of bowel obstruction. Elevated right hemidiaphragm. Right middle lobe and right lower lobe atelectasis and/or pneumonia. Mild renal atrophy on the left. Aortic atherosclerosis. Ventral hernia and right femoral hernia containing only fat. Small amount of free fluid in the cul-de-sac and in the right hernia, etiology unknown. Electronically Signed   By: Paulina Fusi M.D.   On: 12/05/2016 07:07   Ct Head Wo Contrast  Result Date: 11/11/2016 CLINICAL DATA:  Altered level consciousness.  Ventilated patient EXAM: CT HEAD WITHOUT CONTRAST TECHNIQUE: Contiguous axial images were obtained from the base of the skull through the vertex without intravenous contrast. COMPARISON:  None. FINDINGS: Brain: No acute intracranial hemorrhage.  No focal mass lesion. No CT evidence of acute infarction. No midline shift or mass effect. No hydrocephalus. Basilar cisterns are patent. Mild periventricular white matter hypodensities. Mild from cortical atrophy. Small soft tissue mass in the sella turcica measuring 12 mm x 16 mm by 14 mm. (image 35, series 6) Vascular: No hyperdense vessel or unexpected calcification. Skull: Normal. Negative for fracture or focal lesion. Sinuses/Orbits: There is opacification of the LEFT sphenoid hemi sinus. Other: None IMPRESSION: 1. No acute intracranial findings. 2. 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 Recommend MRI with without contrast for further evaluation. 3. Opacification of the LEFT sphenoidsinus is most consistent with chronic sinus inflammation. No bony expansion. 4. Atrophy and white matter microvascular disease. Electronically Signed   By: Genevive Bi M.D.   On: 11/15/2016 19:14   Dg Chest Portable 1 View  Result Date: 12/10/2016 CLINICAL DATA:  ETT placement readjustment EXAM: PORTABLE CHEST 1 VIEW COMPARISON:  12/09/2016 FINDINGS: Endotracheal tube tip is about 2.5 cm superior to carina. No change in elevated right diaphragm and small right pleural effusion and hazy atelectasis or small infiltrate at the right base. Stable cardiomediastinal silhouette with atherosclerosis. Calcified nodules at the left apex. Negative for a pneumothorax. IMPRESSION: Endotracheal tube tip about 2.5 cm superior to carina. Stable small right pleural effusion, elevated right diaphragm and hazy atelectasis or infiltrate at the right base Electronically Signed   By: Jasmine Pang M.D.   On: 12/01/2016 17:57   Dg Chest Portable 1 View  Result Date: 11/16/2016 CLINICAL DATA:  ETT placement EXAM:  PORTABLE CHEST 1 VIEW COMPARISON:  08/26/2016, CT 05/09/2016 FINDINGS: Endotracheal tube tip is just above the carina. Elevated right diaphragm with small right pleural effusion and hazy atelectasis or  small infiltrate at the right base. Stable cardiomediastinal silhouette. Stable convex opacity medial left lung base appears to correspond to fatty hernia density on abdominal CT. Calcified nodules of the left apex. IMPRESSION: 1. Endotracheal tube tip just above the carina 2. Elevated right diaphragm with small right effusion and hazy right basilar atelectasis versus small infiltrate Electronically Signed   By: Jasmine PangKim  Fujinaga M.D.   On: 12/03/2016 17:56    Microbiology: Recent Results (from the past 240 hour(s))  C difficile quick scan w PCR reflex     Status: None   Collection Time: 11/12/2016 10:24 PM  Result Value Ref Range Status   C Diff antigen NEGATIVE NEGATIVE Final   C Diff toxin NEGATIVE NEGATIVE Final   C Diff interpretation No C. difficile detected.  Final  MRSA PCR Screening     Status: None   Collection Time: 11/16/2016 11:18 PM  Result Value Ref Range Status   MRSA by PCR NEGATIVE NEGATIVE Final    Comment:        The GeneXpert MRSA Assay (FDA approved for NASAL specimens only), is one component of a comprehensive MRSA colonization surveillance program. It is not intended to diagnose MRSA infection nor to guide or monitor treatment for MRSA infections.      Labs: CBC:  Recent Labs Lab 12/03/2016 1738 11/19/2016 1750 12/05/16 0630  WBC 17.7*  --  21.7*  NEUTROABS 12.5*  --   --   HGB 13.2 13.9 13.3  HCT 42.8 41.0 41.0  MCV 101.4*  --  98.1  PLT 229  --  193   Basic Metabolic Panel:  Recent Labs Lab 11/14/2016 1738 11/21/2016 1750 11/13/2016 2128 12/05/16 0630  NA 141 139  --  142  K 5.4* 5.0  --  5.8*  CL 102 104  --  109  CO2 18*  --   --  20*  GLUCOSE 265* 264*  --  105*  BUN 51* 52*  --  59*  CREATININE 2.59* 2.30*  --  2.32*  CALCIUM 8.6*  --   --  7.3*  MG  --   --  2.3  --    Liver Function Tests:  Recent Labs Lab 12/03/2016 1738  AST 72*  ALT 28  ALKPHOS 127*  BILITOT 0.8  PROT 5.9*  ALBUMIN 3.3*   No results for input(s): LIPASE, AMYLASE  in the last 168 hours. No results for input(s): AMMONIA in the last 168 hours. Cardiac Enzymes:  Recent Labs Lab 11/27/2016 0120 11/16/2016 1738 12/05/16 0630  TROPONINI 0.57* <0.03 0.53*   BNP (last 3 results)  Recent Labs  08/26/16 1229 11/21/2016 1738  BNP 253.8* 45.5   CBG:  Recent Labs Lab 12/06/16 0605  GLUCAP 93   Time spent: 30 minutes  Signed:  Ajiah Mcglinn  [facility] 12/08/2016, 2:29 PM

## 2016-12-11 NOTE — Progress Notes (Signed)
Declared DNR pt dead with Isidor HoltsLee Worley, RN. Pt was not breathing, had no pulse, and no audible heart sounds for over 2 minutes.

## 2016-12-11 NOTE — Progress Notes (Signed)
Wasted 150mls of IV Morphine in sink with Otilio ConnorsSharon Spradling, RN.

## 2016-12-11 NOTE — Progress Notes (Signed)
150 cc IV Morphine wasted in sink with [place]any May RN.  Medication wasted due to patient being deceased.

## 2016-12-11 DEATH — deceased

## 2018-10-06 IMAGING — CT CT ABD-PELV W/O CM
2 of 8 series · 13 of 46 positions shown, 18 images · non-contrast
Comparison: 05/09/2016

CLINICAL DATA: Left-sided abdominal pain.  Diarrhea.

EXAM:
CT ABDOMEN AND PELVIS WITHOUT CONTRAST
TECHNIQUE: Multidetector CT imaging of the abdomen and pelvis was performed
following the standard protocol without IV contrast.

[Series 8: ap without · axial · non-contrast · 0.75mm/px · z∈[+914,+1289]mm · 10 of 91 slices shown, 15 images]
[im 8/91  soft-tissue]
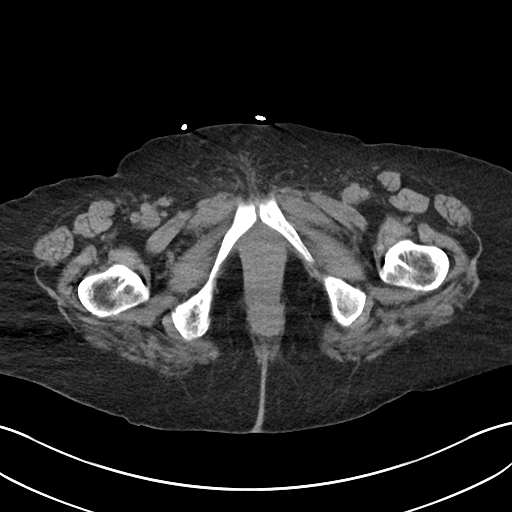
[im 8/91  bone]
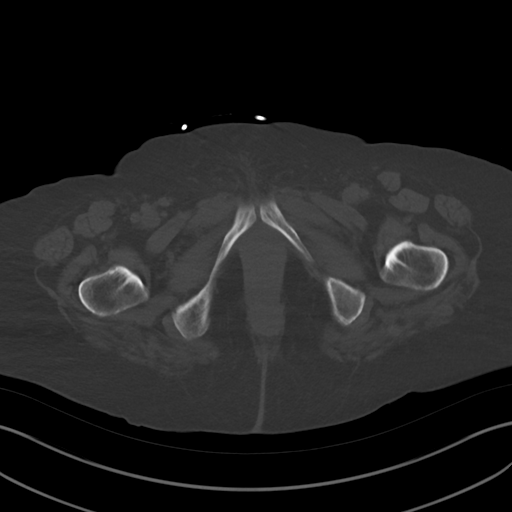
[im 16/91  soft-tissue]
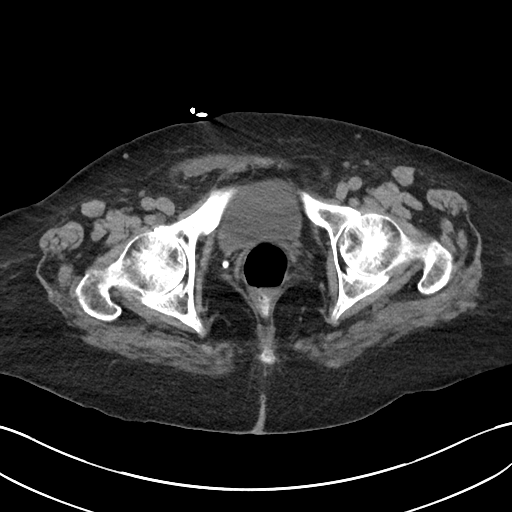
[im 31/91  soft-tissue]
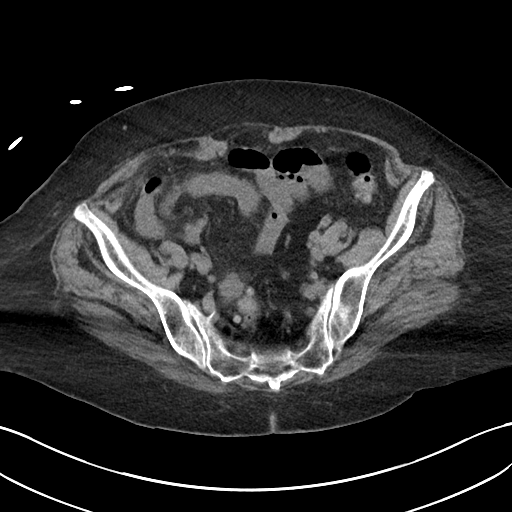
[im 38/91  soft-tissue]
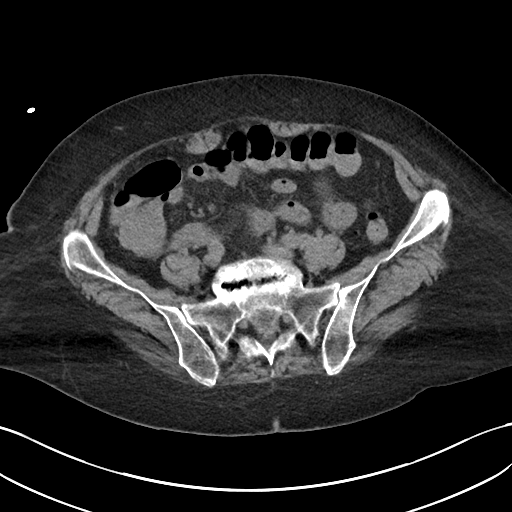
[im 46/91  soft-tissue]
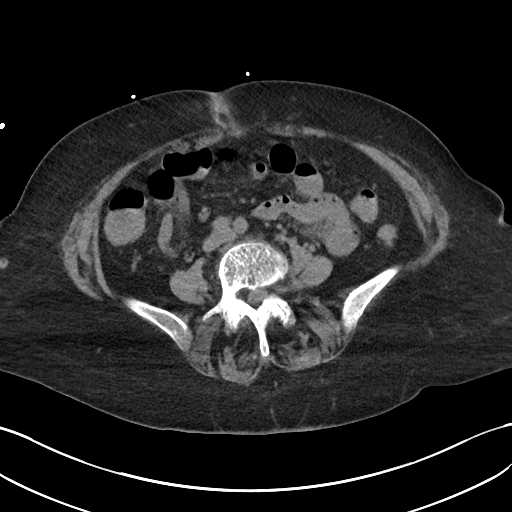
[im 53/91  soft-tissue]
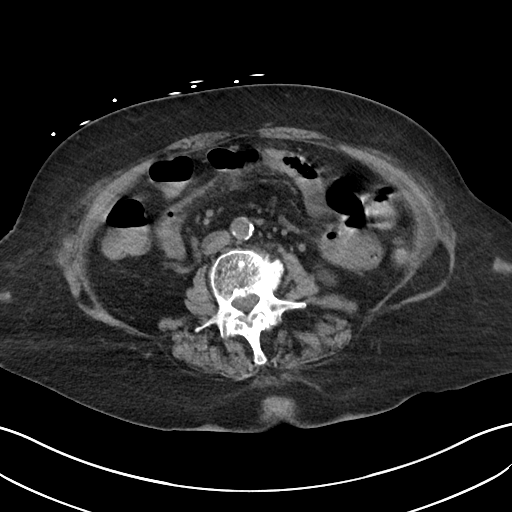
[im 61/91  soft-tissue]
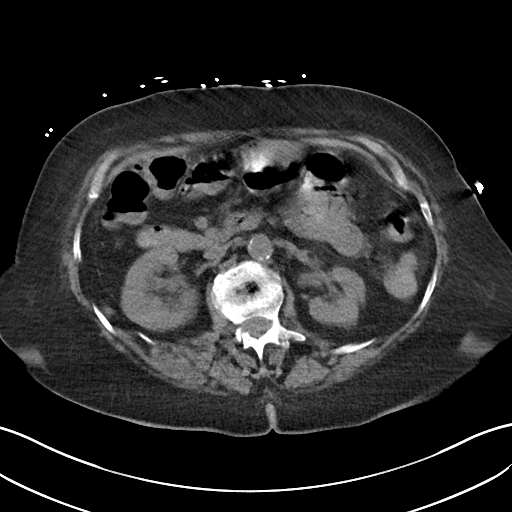
[im 61/91  lung]
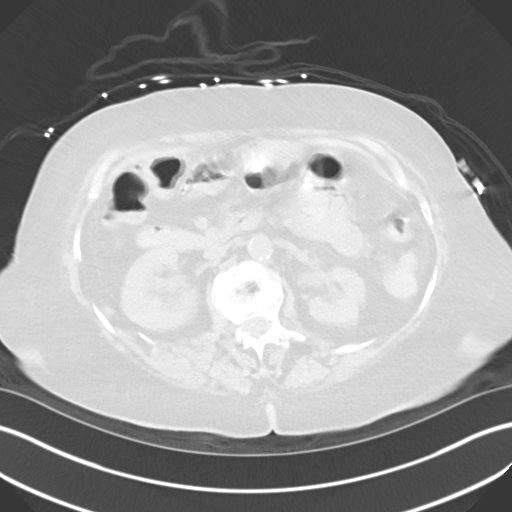
[im 68/91  lung]
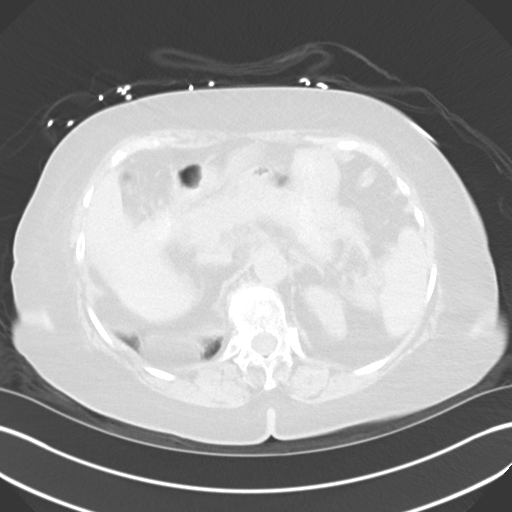
[im 76/91  soft-tissue]
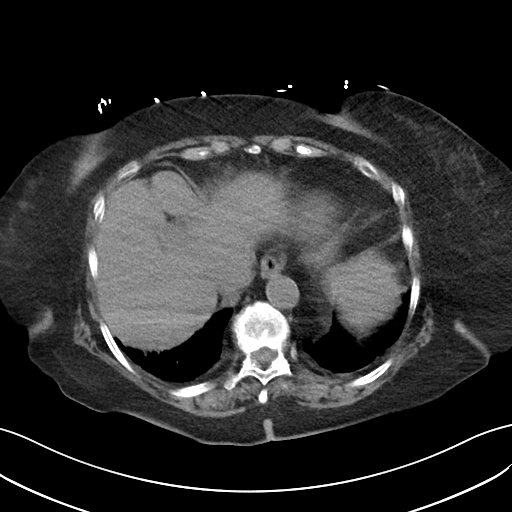
[im 76/91  lung]
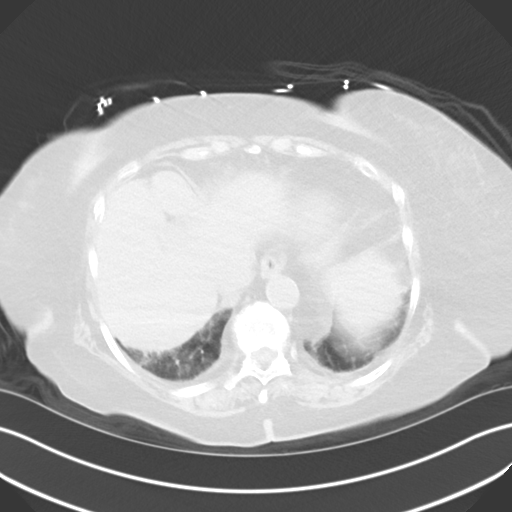
[im 83/91  soft-tissue]
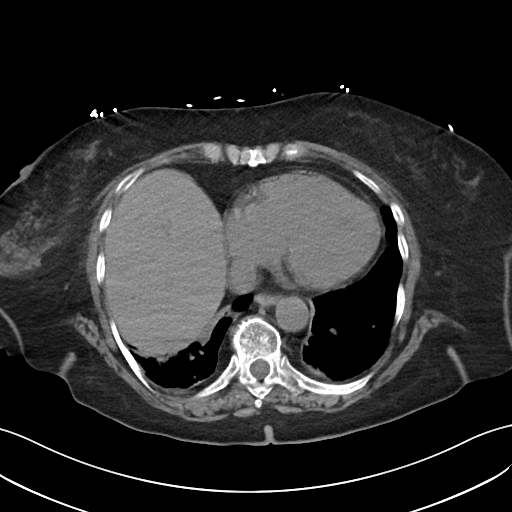
[im 83/91  lung]
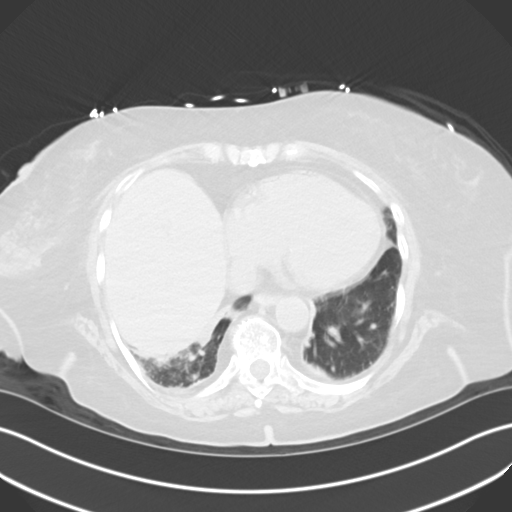
[im 83/91  bone]
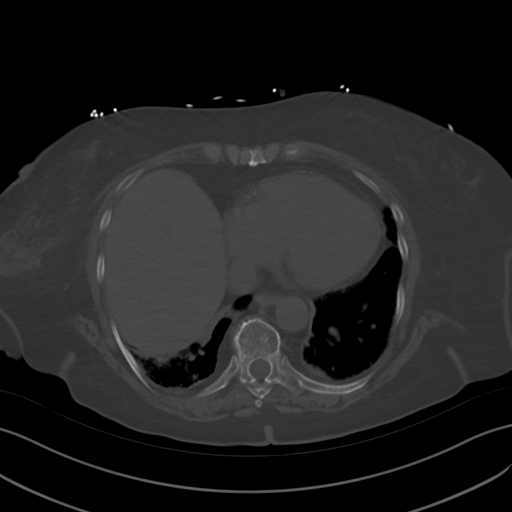

[Series 11: cor · coronal · 0.88mm/px · 3 of 101 slices shown]
[im 26/101  soft-tissue]
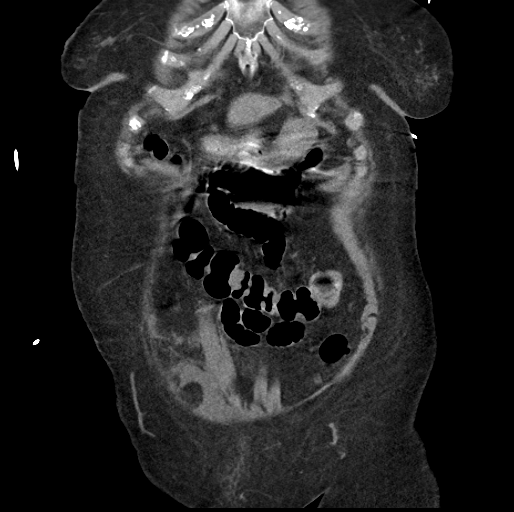
[im 51/101  soft-tissue]
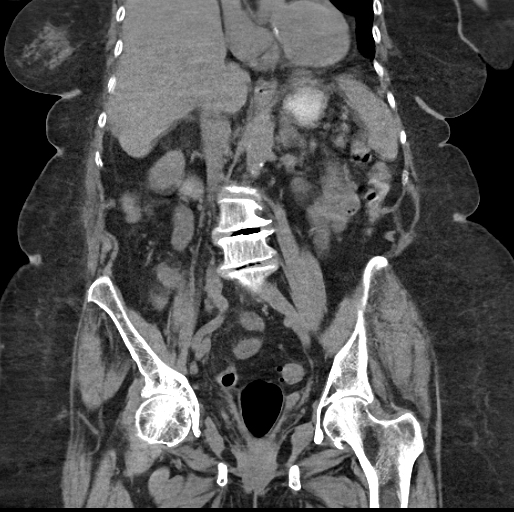
[im 76/101  soft-tissue]
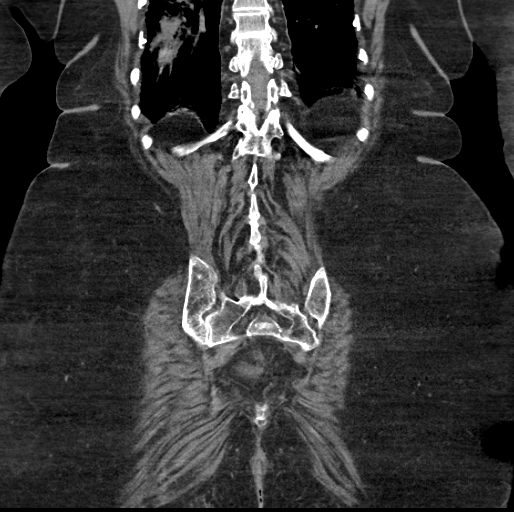

[13 of 46 positions shown; findings below may reference images not displayed]

FINDINGS: Lower chest: Partial collapse/ infiltrate in the right middle lobe
and right lower lobe worrisome for pneumonia. Minimal atelectasis at
the left base. Marked elevation of the right hemidiaphragm.
Diaphragmatic hernias on the right.

Hepatobiliary: No significant liver parenchymal finding. Previous
cholecystectomy.

Pancreas: No pancreatic abnormality seen.

Spleen: Normal

Adrenals/Urinary Tract: Some motion degradation. Adrenal glands are
normal. Atrophy of the left kidney relative to the right. No
evidence of mass, stone or hydronephrosis. No bladder abnormality
seen.

Stomach/Bowel: Diverticulosis without imaging evidence of
diverticulitis. No evidence of bowel obstruction. Small amount of
free fluid in the cul de sac.

Vascular/Lymphatic: Aortic atherosclerosis. No aneurysm. IVC is
normal. No retroperitoneal adenopathy.

Reproductive: Previous hysterectomy.  No pelvic mass.

Other: Small amount of free fluid in the cul de sac as noted above.
No free air. Small supraumbilical midline hernia containing only
fat. Right femoral hernia containing fat and a small amount of
fluid. No intestinal herniation or obstruction in that location.

Musculoskeletal: [REDACTED] curvature in degenerative change of the
spine.
IMPRESSION: No cause of acute pain is identified. Diverticulosis without imaging
evidence of diverticulitis. No sign of bowel obstruction.

Elevated right hemidiaphragm. Right middle lobe and right lower lobe
atelectasis and/or pneumonia.

Mild renal atrophy on the left.

Aortic atherosclerosis.

Ventral hernia and right femoral hernia containing only fat.

Small amount of free fluid in the cul-de-sac and in the right
hernia, etiology unknown.
